# Patient Record
Sex: Male | Born: 1995 | Race: White | Hispanic: No | Marital: Single | Smoking: Never smoker
Health system: Southern US, Community
[De-identification: ages and names within clinical notes are randomized; demographics above are authoritative.]

## PROBLEM LIST (undated history)

## (undated) DIAGNOSIS — I10 Essential (primary) hypertension: Secondary | ICD-10-CM

## (undated) DIAGNOSIS — Z87442 Personal history of urinary calculi: Secondary | ICD-10-CM

## (undated) DIAGNOSIS — J45909 Unspecified asthma, uncomplicated: Secondary | ICD-10-CM

## (undated) HISTORY — PX: EUSTACHIAN TUBE DILATION: SHX6770

## (undated) HISTORY — DX: Unspecified asthma, uncomplicated: J45.909

## (undated) HISTORY — PX: WRIST FRACTURE SURGERY: SHX121

## (undated) HISTORY — PX: OTHER SURGICAL HISTORY: SHX169

## (undated) HISTORY — DX: Essential (primary) hypertension: I10

---
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
## 2015-05-16 ENCOUNTER — Other Ambulatory Visit: Payer: Self-pay | Admitting: Orthopedic Surgery

## 2015-05-16 DIAGNOSIS — M25511 Pain in right shoulder: Secondary | ICD-10-CM

## 2015-05-29 ENCOUNTER — Other Ambulatory Visit: Payer: Self-pay

## 2015-06-06 ENCOUNTER — Other Ambulatory Visit: Payer: Self-pay

## 2015-06-06 ENCOUNTER — Ambulatory Visit
Admission: RE | Admit: 2015-06-06 | Discharge: 2015-06-06 | Disposition: A | Discharge status: Home / Self Care | Payer: 59 | Source: Ambulatory Visit | Attending: Orthopedic Surgery | Admitting: Orthopedic Surgery

## 2015-06-06 DIAGNOSIS — M25511 Pain in right shoulder: Secondary | ICD-10-CM

## 2015-06-06 MED ORDER — IOHEXOL 180 MG/ML  SOLN
14.0000 mL | Freq: Once | INTRAMUSCULAR | Status: AC | PRN
  Administered 2015-06-06: 14 mL via INTRA_ARTICULAR

## 2015-06-06 MED ORDER — METHYLPREDNISOLONE ACETATE 40 MG/ML INJ SUSP (RADIOLOG
120.0000 mg | Freq: Once | INTRAMUSCULAR | Status: AC
  Administered 2015-06-06: 120 mg via INTRA_ARTICULAR

## 2016-02-26 ENCOUNTER — Emergency Department: Payer: 59

## 2016-02-26 ENCOUNTER — Encounter: Payer: Self-pay | Admitting: *Deleted

## 2016-02-26 ENCOUNTER — Emergency Department
Admission: EM | Admit: 2016-02-26 | Discharge: 2016-02-26 | Disposition: A | Discharge status: Home / Self Care | Payer: 59 | Attending: Emergency Medicine | Admitting: Emergency Medicine

## 2016-02-26 DIAGNOSIS — N23 Unspecified renal colic: Secondary | ICD-10-CM

## 2016-02-26 DIAGNOSIS — R1031 Right lower quadrant pain: Secondary | ICD-10-CM | POA: Diagnosis present

## 2016-02-26 LAB — CBC
HCT: 50 % (ref 40.0–52.0)
HEMOGLOBIN: 17.5 g/dL (ref 13.0–18.0)
MCH: 28.9 pg (ref 26.0–34.0)
MCHC: 34.9 g/dL (ref 32.0–36.0)
MCV: 82.7 fL (ref 80.0–100.0)
Platelets: 219 10*3/uL (ref 150–440)
RBC: 6.05 MIL/uL — ABNORMAL HIGH (ref 4.40–5.90)
RDW: 14.1 % (ref 11.5–14.5)
WBC: 9 10*3/uL (ref 3.8–10.6)

## 2016-02-26 LAB — URINALYSIS COMPLETE WITH MICROSCOPIC (ARMC ONLY)
BILIRUBIN URINE: NEGATIVE
Bacteria, UA: NONE SEEN
GLUCOSE, UA: NEGATIVE mg/dL
Ketones, ur: NEGATIVE mg/dL
Leukocytes, UA: NEGATIVE
NITRITE: NEGATIVE
Protein, ur: 100 mg/dL — AB
SPECIFIC GRAVITY, URINE: 1.023 (ref 1.005–1.030)
Squamous Epithelial / LPF: NONE SEEN
pH: 7 (ref 5.0–8.0)

## 2016-02-26 LAB — COMPREHENSIVE METABOLIC PANEL
ALBUMIN: 5.1 g/dL — AB (ref 3.5–5.0)
ALK PHOS: 85 U/L (ref 38–126)
ALT: 39 U/L (ref 17–63)
AST: 35 U/L (ref 15–41)
Anion gap: 10 (ref 5–15)
BILIRUBIN TOTAL: 1.6 mg/dL — AB (ref 0.3–1.2)
BUN: 14 mg/dL (ref 6–20)
CALCIUM: 9.5 mg/dL (ref 8.9–10.3)
CO2: 21 mmol/L — AB (ref 22–32)
CREATININE: 0.92 mg/dL (ref 0.61–1.24)
Chloride: 108 mmol/L (ref 101–111)
GFR calc Af Amer: >60 mL/min (ref >60)
GFR calc non Af Amer: >60 mL/min (ref >60)
GLUCOSE: 114 mg/dL — AB (ref 65–99)
Potassium: 3.7 mmol/L (ref 3.5–5.1)
SODIUM: 139 mmol/L (ref 135–145)
TOTAL PROTEIN: 7.5 g/dL (ref 6.5–8.1)

## 2016-02-26 LAB — LIPASE, BLOOD: Lipase: 18 U/L (ref 11–51)

## 2016-02-26 MED ORDER — HYDROMORPHONE HCL 1 MG/ML IJ SOLN
1.0000 mg | Freq: Once | INTRAMUSCULAR | Status: AC
  Administered 2016-02-26: 1 mg via INTRAVENOUS
  Filled 2016-02-26: qty 1

## 2016-02-26 MED ORDER — SODIUM CHLORIDE 0.9 % IV BOLUS (SEPSIS)
1000.0000 mL | Freq: Once | INTRAVENOUS | Status: AC
  Administered 2016-02-26: 1000 mL via INTRAVENOUS

## 2016-02-26 MED ORDER — OXYCODONE-ACETAMINOPHEN 5-325 MG PO TABS: 1.0000 | ORAL_TABLET | Freq: Four times a day (QID) | ORAL | 0 refills | Status: DC | PRN

## 2016-02-26 MED ORDER — ONDANSETRON 4 MG PO TBDP
4.0000 mg | ORAL_TABLET | Freq: Once | ORAL | Status: AC | PRN
  Administered 2016-02-26: 4 mg via ORAL
  Filled 2016-02-26: qty 1

## 2016-02-26 MED ORDER — IBUPROFEN 800 MG PO TABS: 800.0000 mg | ORAL_TABLET | Freq: Three times a day (TID) | ORAL | 0 refills | Status: DC | PRN

## 2016-02-26 MED ORDER — KETOROLAC TROMETHAMINE 30 MG/ML IJ SOLN
30.0000 mg | Freq: Once | INTRAMUSCULAR | Status: AC
  Administered 2016-02-26: 30 mg via INTRAVENOUS
  Filled 2016-02-26: qty 1

## 2016-02-26 MED ORDER — ONDANSETRON HCL 4 MG/2ML IJ SOLN
4.0000 mg | Freq: Once | INTRAMUSCULAR | Status: AC
  Administered 2016-02-26: 4 mg via INTRAVENOUS
  Filled 2016-02-26: qty 2

## 2016-02-26 MED ORDER — TAMSULOSIN HCL 0.4 MG PO CAPS: 0.4000 mg | ORAL_CAPSULE | Freq: Every day | ORAL | 0 refills | Status: DC

## 2016-02-26 NOTE — ED Triage Notes (Signed)
Pt states RLQ abd pain since this AM with vomiting, pt awake and alert

## 2016-02-26 NOTE — ED Notes (Signed)
Pt in CT at this time.

## 2016-02-26 NOTE — ED Provider Notes (Signed)
[facility]-EMERGENCY DEPARTMENT Provider Note   CSN: [id number] Arrival date & time: 02/26/16  1133     History   Chief Complaint Chief Complaint  Patient presents with  . Abdominal Pain    HPI Gregory Fry is a 20 y.o. male of healthy here presenting with right lower quadrant pain, right flank pain. Patient woke around 8 AM and had acute onset of sharp right lower quadrant pain that radiated to the right flank. Denies any testicular pain. While in the ED, he started developing bright red hematuria. No previous hx of kidney stones. Denies fevers.   The history is provided by the patient.    History reviewed. No pertinent past medical history.  There are no active problems to display for this patient.   No past surgical history on file.     Home Medications    Prior to Admission medications   Not on File    Family History History reviewed. No pertinent family history.  Social History Social History  Substance Use Topics  . Smoking status: Not on file  . Smokeless tobacco: Not on file  . Alcohol use Not on file     Allergies   Review of patient's allergies indicates no known allergies.   Review of Systems Review of Systems  Gastrointestinal: Positive for abdominal pain.  All other systems reviewed and are negative.    Physical Exam Updated Vital Signs BP (!) 155/85 (BP Location: Left Arm)   Pulse 64   Temp 97.5 F (36.4 C) (Oral)   Resp 18   Ht 6\' 4"  (1.93 m)   Wt 290 lb (131.5 kg)   SpO2 99%   BMI 35.30 kg/m   Physical Exam  Constitutional: He is oriented to person, place, and time.  Uncomfortable, writhing around   HENT:  Head: Normocephalic.  Mouth/Throat: Oropharynx is clear and moist.  Eyes: EOM are normal. Pupils are equal, round, and reactive to light.  Neck: Normal range of motion. Neck supple.  Cardiovascular: Normal rate, regular rhythm and normal heart sounds.   Pulmonary/Chest: Effort normal and breath sounds normal. No  respiratory distress. He has no wheezes. He has no rales.  Abdominal: Soft. Bowel sounds are normal.  + RLQ tenderness, no rebound. Mild R CVAT   Genitourinary:  Genitourinary Comments: Testicles nontender, good lie   Musculoskeletal: Normal range of motion.  Neurological: He is alert and oriented to person, place, and time.  Skin: Skin is warm.  Psychiatric: He has a normal mood and affect.  Nursing note and vitals reviewed.    ED Treatments / Results  Labs (all labs ordered are listed, but only abnormal results are displayed) Labs Reviewed  COMPREHENSIVE METABOLIC PANEL - Abnormal; Notable for the following:       Result Value   CO2 21 (*)    Glucose, Bld 114 (*)    Albumin 5.1 (*)    Total Bilirubin 1.6 (*)    All other components within normal limits  CBC - Abnormal; Notable for the following:    RBC 6.05 (*)    All other components within normal limits  URINALYSIS COMPLETEWITH MICROSCOPIC ([facility] ONLY) - Abnormal; Notable for the following:    Color, Urine YELLOW (*)    APPearance CLOUDY (*)    Hgb urine dipstick 3+ (*)    Protein, ur 100 (*)    All other components within normal limits  LIPASE, BLOOD    EKG  EKG Interpretation None  Radiology Ct Renal Stone Study  Result Date: 02/26/2016 CLINICAL DATA:  Right-sided abdomen and right flank pain, some vomiting EXAM: CT ABDOMEN AND PELVIS WITHOUT CONTRAST TECHNIQUE: Multidetector CT imaging of the abdomen and pelvis was performed following the standard protocol without IV contrast. COMPARISON:  None. FINDINGS: Lower chest: The lung bases are clear. Hepatobiliary: The liver is unremarkable in the unenhanced state. No calcified gallstones are seen. Pancreas: The pancreas is normal in size and the pancreatic duct is not dilated. Spleen: The spleen is unremarkable. Adrenals/Urinary Tract: The adrenal glands are symmetrical and normal. A single 3 mm left upper pole renal calculus is present without obstruction.  However, there is evidence of mild to moderate right hydronephrosis and hydroureter. The right ureter remains slightly dilated to the urinary bladder where there is a 2 mm calculus at the right UV junction. The left ureter is unremarkable. The urinary bladder is decompressed and cannot be well evaluated. Stomach/Bowel: The stomach is minimally distended but no abnormality is evident. No small bowel distention is seen. The colon is largely decompressed. The terminal ileum and the appendix are unremarkable. Vascular/Lymphatic: The abdominal aorta is normal in caliber. No adenopathy is seen. Reproductive: The prostate gland is normal in size. Other: None Musculoskeletal: The lumbar vertebrae are in normal alignment. 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 IMPRESSION: 1. 2 mm right ureteral calculus at the right UV junction creating mild right hydronephrosis and hydroureter. 2. Single nonobstructing left upper pole renal calculus of 3 mm in diameter. 3. Bilateral pars defects at L5.  No malalignment Electronically Signed   By: Dwyane DeePaul  Barry M.D.   On: 02/26/2016 13:21    Procedures Procedures (including critical care time)  Medications Ordered in ED Medications  ondansetron (ZOFRAN-ODT) disintegrating tablet 4 mg (4 mg Oral Given 02/26/16 1151)  HYDROmorphone (DILAUDID) injection 1 mg (1 mg Intravenous Given 02/26/16 1232)  sodium chloride 0.9 % bolus 1,000 mL (1,000 mLs Intravenous New Bag/Given 02/26/16 1226)  ondansetron (ZOFRAN) injection 4 mg (4 mg Intravenous Given 02/26/16 1228)  ketorolac (TORADOL) 30 MG/ML injection 30 mg (30 mg Intravenous Given 02/26/16 1233)     Initial Impression / Assessment and Plan / ED Course  I have reviewed the triage vital signs and the nursing notes.  Pertinent labs & imaging results that were available during my care of the patient were reviewed by me and considered in my medical decision making (see chart for details).  Clinical Course      Gregory Fry is a 20 y.o. male here with RLQ pain, hematuria. Likely renal colic. Will get labs, UA. CT renal stone. Will give pain meds and IVF and reassess.   2:19 PM Pain controlled. Cr 0.9. UA + blood, and WBC but has no leuk or nitrate or bacteria. I think WBC count in the UA may be contamination from over hematuria. Urine culture sent and hold off on abx. Labs unremarkable. CT showed R 2 mm stone with hydro, some L intra renal stones. Will dc home with motrin, percocet, flomax, urology follow up    Final Clinical Impressions(s) / ED Diagnoses   Final diagnoses:  None    New Prescriptions New Prescriptions   No medications on file     Charlynne Panderavid Hsienta Cyler Kappes, MD 02/26/16 1421

## 2016-02-26 NOTE — ED Triage Notes (Signed)
Pt with RUQ pain started this am with vomiting.

## 2016-02-26 NOTE — Discharge Instructions (Signed)
Stay hydrated.   Avoid red meat.   See urology.   Take motrin for pain.   Take flomax.  Take percocet for severe pain. Do NOT drive with it.   Return to Er if you have severe abdominal pain, vomiting, fevers

## 2016-02-28 LAB — URINE CULTURE

## 2017-01-06 ENCOUNTER — Encounter: Payer: Self-pay | Admitting: Emergency Medicine

## 2017-01-06 ENCOUNTER — Emergency Department
Admission: EM | Admit: 2017-01-06 | Discharge: 2017-01-06 | Disposition: A | Discharge status: Home / Self Care | Payer: BLUE CROSS/BLUE SHIELD | Attending: Emergency Medicine | Admitting: Emergency Medicine

## 2017-01-06 ENCOUNTER — Emergency Department: Payer: BLUE CROSS/BLUE SHIELD

## 2017-01-06 DIAGNOSIS — Z79899 Other long term (current) drug therapy: Secondary | ICD-10-CM | POA: Insufficient documentation

## 2017-01-06 DIAGNOSIS — N200 Calculus of kidney: Secondary | ICD-10-CM | POA: Diagnosis not present

## 2017-01-06 DIAGNOSIS — R1030 Lower abdominal pain, unspecified: Secondary | ICD-10-CM | POA: Diagnosis present

## 2017-01-06 LAB — URINALYSIS, COMPLETE (UACMP) WITH MICROSCOPIC
BACTERIA UA: NONE SEEN
Bilirubin Urine: NEGATIVE
Glucose, UA: NEGATIVE mg/dL
KETONES UR: NEGATIVE mg/dL
Leukocytes, UA: NEGATIVE
Nitrite: NEGATIVE
Protein, ur: NEGATIVE mg/dL
SQUAMOUS EPITHELIAL / LPF: NONE SEEN
Specific Gravity, Urine: 1.026 (ref 1.005–1.030)
pH: 6 (ref 5.0–8.0)

## 2017-01-06 LAB — CBC WITH DIFFERENTIAL/PLATELET
BASOS ABS: 0.1 10*3/uL (ref 0–0.1)
Basophils Relative: 1 %
EOS PCT: 2 %
Eosinophils Absolute: 0.2 10*3/uL (ref 0–0.7)
HCT: 45.9 % (ref 40.0–52.0)
Hemoglobin: 16.2 g/dL (ref 13.0–18.0)
LYMPHS PCT: 32 %
Lymphs Abs: 3 10*3/uL (ref 1.0–3.6)
MCH: 28.9 pg (ref 26.0–34.0)
MCHC: 35.4 g/dL (ref 32.0–36.0)
MCV: 81.7 fL (ref 80.0–100.0)
Monocytes Absolute: 1.3 10*3/uL — ABNORMAL HIGH (ref 0.2–1.0)
Monocytes Relative: 14 %
NEUTROS PCT: 51 %
Neutro Abs: 4.8 10*3/uL (ref 1.4–6.5)
PLATELETS: 240 10*3/uL (ref 150–440)
RBC: 5.61 MIL/uL (ref 4.40–5.90)
RDW: 12.8 % (ref 11.5–14.5)
WBC: 9.4 10*3/uL (ref 3.8–10.6)

## 2017-01-06 LAB — COMPREHENSIVE METABOLIC PANEL
ALT: 38 U/L (ref 17–63)
AST: 38 U/L (ref 15–41)
Albumin: 4.6 g/dL (ref 3.5–5.0)
Alkaline Phosphatase: 61 U/L (ref 38–126)
Anion gap: 6 (ref 5–15)
BUN: 20 mg/dL (ref 6–20)
CHLORIDE: 109 mmol/L (ref 101–111)
CO2: 26 mmol/L (ref 22–32)
CREATININE: 0.99 mg/dL (ref 0.61–1.24)
Calcium: 9.2 mg/dL (ref 8.9–10.3)
GFR calc Af Amer: >60 mL/min (ref >60)
Glucose, Bld: 134 mg/dL — ABNORMAL HIGH (ref 65–99)
Potassium: 3.6 mmol/L (ref 3.5–5.1)
SODIUM: 141 mmol/L (ref 135–145)
Total Bilirubin: 0.9 mg/dL (ref 0.3–1.2)
Total Protein: 7.2 g/dL (ref 6.5–8.1)

## 2017-01-06 MED ORDER — OXYCODONE-ACETAMINOPHEN 5-325 MG PO TABS
2.0000 | ORAL_TABLET | Freq: Once | ORAL | Status: AC
  Administered 2017-01-06: 2 via ORAL
  Filled 2017-01-06: qty 2

## 2017-01-06 MED ORDER — ONDANSETRON HCL 4 MG/2ML IJ SOLN
4.0000 mg | Freq: Once | INTRAMUSCULAR | Status: AC
  Administered 2017-01-06: 4 mg via INTRAVENOUS

## 2017-01-06 MED ORDER — TAMSULOSIN HCL 0.4 MG PO CAPS
0.4000 mg | ORAL_CAPSULE | Freq: Once | ORAL | Status: AC
  Administered 2017-01-06: 0.4 mg via ORAL
  Filled 2017-01-06: qty 1

## 2017-01-06 MED ORDER — ONDANSETRON HCL 4 MG/2ML IJ SOLN
INTRAMUSCULAR | Status: AC
  Filled 2017-01-06: qty 2

## 2017-01-06 MED ORDER — MORPHINE SULFATE (PF) 4 MG/ML IV SOLN
4.0000 mg | Freq: Once | INTRAVENOUS | Status: AC
  Administered 2017-01-06: 4 mg via INTRAVENOUS

## 2017-01-06 MED ORDER — OXYCODONE-ACETAMINOPHEN 5-325 MG PO TABS: 1.0000 | ORAL_TABLET | Freq: Four times a day (QID) | ORAL | 0 refills | Status: DC | PRN

## 2017-01-06 MED ORDER — ONDANSETRON 4 MG PO TBDP: 4.0000 mg | ORAL_TABLET | Freq: Three times a day (TID) | ORAL | 0 refills | Status: DC | PRN

## 2017-01-06 MED ORDER — KETOROLAC TROMETHAMINE 30 MG/ML IJ SOLN
INTRAMUSCULAR | Status: AC
  Administered 2017-01-06: 30 mg via INTRAVENOUS
  Filled 2017-01-06: qty 1

## 2017-01-06 MED ORDER — TAMSULOSIN HCL 0.4 MG PO CAPS: 0.4000 mg | ORAL_CAPSULE | Freq: Every day | ORAL | 0 refills | Status: DC

## 2017-01-06 MED ORDER — MORPHINE SULFATE (PF) 4 MG/ML IV SOLN
INTRAVENOUS | Status: AC
  Filled 2017-01-06: qty 1

## 2017-01-06 MED ORDER — MORPHINE SULFATE (PF) 4 MG/ML IV SOLN
4.0000 mg | Freq: Once | INTRAVENOUS | Status: AC
  Administered 2017-01-06: 4 mg via INTRAVENOUS
  Filled 2017-01-06: qty 1

## 2017-01-06 NOTE — ED Triage Notes (Signed)
Patient ambulatory to triage with steady gait, without difficulty, appears uncomfortable, grimacing; st awoke with left lower abd pain accomp by N/V; st hx kidney stone

## 2017-01-06 NOTE — Discharge Instructions (Signed)
Please follow-up with urology if you have persistent pain.

## 2017-01-06 NOTE — ED Provider Notes (Signed)
[facility] Emergency Department Provider Note   ____________________________________________   First MD Initiated Contact with Patient 01/06/17 [phone]     (approximate)  I have reviewed the triage vital signs and the nursing notes.   HISTORY  Chief Complaint Abdominal Pain    HPI Gregory Fry is a 21 y.o. male who comes into the hospital today with some left flank and lower abdomen pain. The patient has a history of kidney stones about a year ago. The patient woke up his mother and stated that he was having a lot of pain. It's on the left side and it feels similar as to when he had kidney stones previously. He states that he is urinating well but there is no blood in it. He has had some nausea and vomiting that he vomited more during the time when he had pain versus after. The patient states that his pain radiates down to his testicle on the left.   History reviewed. No pertinent past medical history.  There are no active problems to display for this patient.   History reviewed. No pertinent surgical history.  Prior to Admission medications   Medication Sig Start Date End Date Taking? Authorizing Provider  ibuprofen (ADVIL,MOTRIN) 800 MG tablet Take 1 tablet (800 mg total) by mouth every 8 (eight) hours as needed. 02/26/16   Charlynne Pander, MD  ondansetron (ZOFRAN ODT) 4 MG disintegrating tablet Take 1 tablet (4 mg total) by mouth every 8 (eight) hours as needed for nausea or vomiting. 01/06/17   Rebecka Apley, MD  oxyCODONE-acetaminophen (PERCOCET) 5-325 MG tablet Take 1 tablet by mouth every 6 (six) hours as needed. 02/26/16   Charlynne Pander, MD  oxyCODONE-acetaminophen (ROXICET) 5-325 MG tablet Take 1 tablet by mouth every 6 (six) hours as needed. 01/06/17   Rebecka Apley, MD  tamsulosin (FLOMAX) 0.4 MG CAPS capsule Take 1 capsule (0.4 mg total) by mouth daily. 02/26/16   Charlynne Pander, MD  tamsulosin (FLOMAX) 0.4 MG CAPS  capsule Take 1 capsule (0.4 mg total) by mouth daily. 01/06/17   Rebecka Apley, MD    Allergies Patient has no known allergies.  No family history on file.  Social History Social History  Substance Use Topics  . Smoking status: Never Smoker  . Smokeless tobacco: Never Used  . Alcohol use Not on file    Review of Systems  Constitutional: No fever/chills Eyes: No visual changes. ENT: No sore throat. Cardiovascular: Denies chest pain. Respiratory: Denies shortness of breath. Gastrointestinal: abdominal pain, nausea, vomiting.  No diarrhea.  No constipation. Genitourinary: Negative for dysuria. Musculoskeletal: left back pain. Skin: Negative for rash. Neurological: Negative for headaches, focal weakness or numbness.   ____________________________________________   PHYSICAL EXAM:  VITAL SIGNS: ED Triage Vitals  Enc Vitals Group     BP 01/06/17 0432 (!) 144/95     Pulse Rate 01/06/17 0432 75     Resp 01/06/17 0432 20     Temp 01/06/17 0432 98.1 F (36.7 C)     Temp src --      SpO2 01/06/17 0432 97 %     Weight 01/06/17 0431 300 lb (136.1 kg)     Height 01/06/17 0431 6\' 4"  (1.93 m)     Head Circumference --      Peak Flow --      Pain Score 01/06/17 0431 10     Pain Loc --      Pain Edu? --  Excl. in GC? --     Constitutional: Alert and oriented. Well appearing and in severe distress. Eyes: Conjunctivae are normal. PERRL. EOMI. Head: Atraumatic. Nose: No congestion/rhinnorhea. Mouth/Throat: Mucous membranes are moist.  Oropharynx non-erythematous. Cardiovascular: Normal rate, regular rhythm. Grossly normal heart sounds.  Good peripheral circulation. Respiratory: Normal respiratory effort.  No retractions. Lungs CTAB. Gastrointestinal: Soft with some left lower abd tenderness to palpation. No distention. Positive bowel sounds, left CVA tenderness to palpation Musculoskeletal: No lower extremity tenderness nor edema.   Neurologic:  Normal speech and  language.  Skin:  Skin is warm, dry and intact.  Psychiatric: Mood and affect are normal.   ____________________________________________   LABS (all labs ordered are listed, but only abnormal results are displayed)  Labs Reviewed  CBC WITH DIFFERENTIAL/PLATELET - Abnormal; Notable for the following:       Result Value   Monocytes Absolute 1.3 (*)    All other components within normal limits  COMPREHENSIVE METABOLIC PANEL - Abnormal; Notable for the following:    Glucose, Bld 134 (*)    All other components within normal limits  URINALYSIS, COMPLETE (UACMP) WITH MICROSCOPIC - Abnormal; Notable for the following:    Color, Urine YELLOW (*)    APPearance CLEAR (*)    Hgb urine dipstick LARGE (*)    All other components within normal limits   ____________________________________________  EKG  none ____________________________________________  RADIOLOGY  Ct Renal Stone Study  Result Date: 01/06/2017 CLINICAL DATA:  Left lower abdominal pain with nausea and vomiting EXAM: CT ABDOMEN AND PELVIS WITHOUT CONTRAST TECHNIQUE: Multidetector CT imaging of the abdomen and pelvis was performed following the standard protocol without IV contrast. COMPARISON:  None. FINDINGS: Lower chest: No acute abnormality. Hepatobiliary: No focal liver abnormality is seen. No gallstones, gallbladder wall thickening, or biliary dilatation. Pancreas: Unremarkable. No pancreatic ductal dilatation or surrounding inflammatory changes. Spleen: Normal in size without focal abnormality. Adrenals/Urinary Tract: Normal adrenals. There is left hydronephrosis and ureteral dilatation due to an obstructing 2 mm calculus at the ureterovesical junction. There is an upper pole 3 mm left collecting system calculus. There are 2 mm calculi of the right renal collecting system. Right ureter is normal. Urinary bladder is normal. Stomach/Bowel: Stomach is within normal limits. Appendix is normal. No evidence of bowel wall thickening,  distention, or inflammatory changes. Vascular/Lymphatic: No significant vascular findings are present. No enlarged abdominal or pelvic lymph nodes. Reproductive: Unremarkable Other: No ascites. 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 IMPRESSION: 1. Obstructing 2 mm left UVJ calculus with moderate left hydronephrosis. 2. Bilateral nephrolithiasis 3. L5 spondylolysis with grade 1 spondylolisthesis. Electronically Signed   By: Ellery Plunk M.D.   On: 01/06/2017 05:41    ____________________________________________   PROCEDURES  Procedure(s) performed: None  Procedures  Critical Care performed: No  ____________________________________________   INITIAL IMPRESSION / ASSESSMENT AND PLAN / ED COURSE  Pertinent labs & imaging results that were available during my care of the patient were reviewed by me and considered in my medical decision making (see chart for details).  This is a 21 year old male who comes into the hospital today with some left flank and left lower abdomen pain. I did give the patient 2 doses of morphine as well as some Toradol for his pain. The medication did help with the symptoms. After the Toradol the patient was pain-free. The patient has a 2 mm stone on the left. He reports that he was out all day sweating and drank sweet tea. Since  the patient's pain is improved she'll be discharged to follow-up with urology.      ____________________________________________   FINAL CLINICAL IMPRESSION(S) / ED DIAGNOSES  Final diagnoses:  Kidney stone      NEW MEDICATIONS STARTED DURING THIS VISIT:  New Prescriptions   ONDANSETRON (ZOFRAN ODT) 4 MG DISINTEGRATING TABLET    Take 1 tablet (4 mg total) by mouth every 8 (eight) hours as needed for nausea or vomiting.   OXYCODONE-ACETAMINOPHEN (ROXICET) 5-325 MG TABLET    Take 1 tablet by mouth every 6 (six) hours as needed.   TAMSULOSIN (FLOMAX) 0.4 MG CAPS CAPSULE    Take 1 capsule  (0.4 mg total) by mouth daily.     Note:  This document was prepared using Dragon voice recognition software and may include unintentional dictation errors.    Rebecka ApleyWebster, Colinda Barth P, MD 01/06/17 [phone]

## 2017-02-04 ENCOUNTER — Ambulatory Visit (INDEPENDENT_AMBULATORY_CARE_PROVIDER_SITE_OTHER): Payer: BLUE CROSS/BLUE SHIELD | Admitting: Urology

## 2017-02-04 ENCOUNTER — Encounter: Payer: Self-pay | Admitting: Urology

## 2017-02-04 VITALS — BP 129/78 | HR 106 | Ht 76.0 in | Wt 290.9 lb

## 2017-02-04 DIAGNOSIS — N200 Calculus of kidney: Secondary | ICD-10-CM | POA: Diagnosis not present

## 2017-02-04 LAB — URINALYSIS, COMPLETE
Bilirubin, UA: NEGATIVE
GLUCOSE, UA: NEGATIVE
LEUKOCYTES UA: NEGATIVE
Nitrite, UA: NEGATIVE
Specific Gravity, UA: >1.03 — ABNORMAL HIGH (ref 1.005–1.030)
Urobilinogen, Ur: 0.2 mg/dL (ref 0.2–1.0)
pH, UA: 5.5 (ref 5.0–7.5)

## 2017-02-04 NOTE — Progress Notes (Addendum)
02/04/2017 2:29 PM   Gregory Fry 08-24-1995 [id number]  Referring provider: [facility] [address] [place], [place] [address]  Chief Complaint  Patient presents with  . Nephrolithiasis    HPI: The patient is a 21 year old gentleman who presents for follow-up after being diagnosed with 2 mm left UVJ stone with moderate left hydronephrosis proximally 1 month ago in the emergency department.  I review the images, he also was found to have a 3 mm nonobstructing left upper pole calculus. He believes he has passed that stone because he is now asymptomatic. This is his second stone episode in his life. His last stone passed spontaneously last year. He had no previous history of surgery for stones. When he did present he was having left flank and groin pain. That has again resolved. He currently is asymptomatic with no complaints.   PMH: No past medical history on file.  Surgical History: No past surgical history on file.  Home Medications:  Allergies as of 02/04/2017   No Known Allergies     Medication List       Accurate as of 02/04/17  2:29 PM. Always use your most recent med list.          ibuprofen 800 MG tablet Commonly known as:  ADVIL,MOTRIN Take 1 tablet (800 mg total) by mouth every 8 (eight) hours as needed.   ondansetron 4 MG disintegrating tablet Commonly known as:  ZOFRAN ODT Take 1 tablet (4 mg total) by mouth every 8 (eight) hours as needed for nausea or vomiting.   oxyCODONE-acetaminophen 5-325 MG tablet Commonly known as:  ROXICET Take 1 tablet by mouth every 6 (six) hours as needed.   tamsulosin 0.4 MG Caps capsule Commonly known as:  FLOMAX Take 1 capsule (0.4 mg total) by mouth daily.            Discharge Care Instructions        Start     Ordered   02/04/17 0000  Urinalysis, Complete     02/04/17 1351   02/04/17 0000  US RENAL    Question Answer Comment  Reason for Exam (SYMPTOM  OR DIAGNOSIS REQUIRED)  kidney stone follow up   Preferred imaging location? [facility]      02/04/17 1429      Allergies: No Known Allergies  Family History: Family History  Problem Relation Age of Onset  . Bladder Cancer Neg Hx   . Kidney cancer Neg Hx   . Prostate cancer Neg Hx     Social History:  reports that he has never smoked. He has never used smokeless tobacco. He reports that he uses drugs, including Marijuana. He reports that he does not drink alcohol.  ROS: UROLOGY Frequent Urination?: No Hard to postpone urination?: No Burning/pain with urination?: No Get up at night to urinate?: No Leakage of urine?: No Urine stream starts and stops?: No Trouble starting stream?: No Do you have to strain to urinate?: No Blood in urine?: No Urinary tract infection?: No Sexually transmitted disease?: No Injury to kidneys or bladder?: No Painful intercourse?: No Weak stream?: No Erection problems?: No Penile pain?: No  Gastrointestinal Nausea?: No Vomiting?: No Indigestion/heartburn?: No Diarrhea?: No Constipation?: No  Constitutional Fever: No Night sweats?: No Weight loss?: No Fatigue?: No  Skin Skin rash/lesions?: No Itching?: No  Eyes Blurred vision?: No Double vision?: No  Ears/Nose/Throat Sore throat?: No Sinus problems?: No  Hematologic/Lymphatic Swollen glands?: No Easy bruising?: No  Cardiovascular  Leg swelling?: No Chest pain?: No  Respiratory Cough?: No Shortness of breath?: No  Endocrine Excessive thirst?: No  Musculoskeletal Back pain?: No Joint pain?: No  Neurological Headaches?: No Dizziness?: No  Psychologic Depression?: No Anxiety?: No  Physical Exam: BP 129/78 (BP Location: Right Arm, Patient Position: Sitting, Cuff Size: Large)   Pulse (!) 106   Ht  (1.93 m)   Wt 290 lb 14.4 oz (132 kg)   BMI 35.41 kg/m   Constitutional:  Alert and oriented, No acute distress. HEENT: [place] AT, moist mucus membranes.  Trachea midline, no  masses. Cardiovascular: No clubbing, cyanosis, or edema. Respiratory: Normal respiratory effort, no increased work of breathing. GI: Abdomen is soft, nontender, nondistended, no abdominal masses GU: No CVA tenderness.  Skin: No rashes, bruises or suspicious lesions. Lymph: No cervical or inguinal adenopathy. Neurologic: Grossly intact, no focal deficits, moving all 4 extremities. Psychiatric: Normal mood and affect.  Laboratory Data: Lab Results  Component Value Date   WBC 9.4 01/06/2017   HGB 16.2 01/06/2017   HCT 45.9 01/06/2017   MCV 81.7 01/06/2017   PLT 240 01/06/2017    Lab Results  Component Value Date   CREATININE 0.99 01/06/2017    No results found for: PSA  No results found for: TESTOSTERONE  No results found for: HGBA1C  Urinalysis    Component Value Date/Time   COLORURINE YELLOW (A) 01/06/2017 0550   APPEARANCEUR CLEAR (A) 01/06/2017 0550   LABSPEC 1.026 01/06/2017 0550   PHURINE 6.0 01/06/2017 0550   GLUCOSEU NEGATIVE 01/06/2017 0550   HGBUR LARGE (A) 01/06/2017 0550   BILIRUBINUR NEGATIVE 01/06/2017 0550   KETONESUR NEGATIVE 01/06/2017 0550   PROTEINUR NEGATIVE 01/06/2017 0550   NITRITE NEGATIVE 01/06/2017 0550   LEUKOCYTESUR NEGATIVE 01/06/2017 0550    Pertinent Imaging: CT stone protocol reviewed by myself as discussed above.  Assessment & Plan:    1. 2 mm left UVJ stone I discussed with the patient that his stone is likely passed based on his presentation and clinical findings. However, I would like to confirm that his hydronephrosis has resolved to ensure that his stone is gone. We'll obtain a renal ultrasound and call the patient with the results. I also gave the patient a stone basket since he still has a stone in the left kidney and we do not know stone composition at this time. We did go over ways to prevent stone formation and the ABC's of stone formation booklet. He would probably benefit fit for metabolic workup, however this would be more  beneficial if we knew his stone composition. He will bring [place] a stone if he passes one in the future for analysis. We will call with results of his renal ultrasound. Otherwise, he will follow-up as needed.  Hildred Laser, MD  [facility] [address] [place], [place] [address] [phone]

## 2019-03-11 ENCOUNTER — Emergency Department: Payer: No Typology Code available for payment source

## 2019-03-11 ENCOUNTER — Other Ambulatory Visit: Payer: Self-pay

## 2019-03-11 ENCOUNTER — Encounter: Payer: Self-pay | Admitting: Emergency Medicine

## 2019-03-11 DIAGNOSIS — Z79899 Other long term (current) drug therapy: Secondary | ICD-10-CM | POA: Insufficient documentation

## 2019-03-11 DIAGNOSIS — R1030 Lower abdominal pain, unspecified: Secondary | ICD-10-CM | POA: Diagnosis present

## 2019-03-11 DIAGNOSIS — N1330 Unspecified hydronephrosis: Secondary | ICD-10-CM | POA: Diagnosis not present

## 2019-03-11 DIAGNOSIS — N201 Calculus of ureter: Secondary | ICD-10-CM | POA: Diagnosis not present

## 2019-03-11 LAB — URINALYSIS, COMPLETE (UACMP) WITH MICROSCOPIC
Bacteria, UA: NONE SEEN
Bilirubin Urine: NEGATIVE
Glucose, UA: NEGATIVE mg/dL
Ketones, ur: NEGATIVE mg/dL
Leukocytes,Ua: NEGATIVE
Nitrite: NEGATIVE
Protein, ur: 30 mg/dL — AB
RBC / HPF: >50 RBC/hpf — ABNORMAL HIGH (ref 0–5)
Specific Gravity, Urine: 1.026 (ref 1.005–1.030)
pH: 5 (ref 5.0–8.0)

## 2019-03-11 LAB — CBC
HCT: 48.5 % (ref 39.0–52.0)
Hemoglobin: 16.6 g/dL (ref 13.0–17.0)
MCH: 28.2 pg (ref 26.0–34.0)
MCHC: 34.2 g/dL (ref 30.0–36.0)
MCV: 82.3 fL (ref 80.0–100.0)
Platelets: 300 10*3/uL (ref 150–400)
RBC: 5.89 MIL/uL — ABNORMAL HIGH (ref 4.22–5.81)
RDW: 12.3 % (ref 11.5–15.5)
WBC: 12.4 10*3/uL — ABNORMAL HIGH (ref 4.0–10.5)
nRBC: 0 % (ref 0.0–0.2)

## 2019-03-11 LAB — COMPREHENSIVE METABOLIC PANEL
ALT: 34 U/L (ref 0–44)
AST: 31 U/L (ref 15–41)
Albumin: 5.1 g/dL — ABNORMAL HIGH (ref 3.5–5.0)
Alkaline Phosphatase: 73 U/L (ref 38–126)
Anion gap: 13 (ref 5–15)
BUN: 12 mg/dL (ref 6–20)
CO2: 22 mmol/L (ref 22–32)
Calcium: 9.9 mg/dL (ref 8.9–10.3)
Chloride: 106 mmol/L (ref 98–111)
Creatinine, Ser: 0.95 mg/dL (ref 0.61–1.24)
GFR calc Af Amer: >60 mL/min (ref >60)
GFR calc non Af Amer: >60 mL/min (ref >60)
Glucose, Bld: 116 mg/dL — ABNORMAL HIGH (ref 70–99)
Potassium: 3.8 mmol/L (ref 3.5–5.1)
Sodium: 141 mmol/L (ref 135–145)
Total Bilirubin: 2.1 mg/dL — ABNORMAL HIGH (ref 0.3–1.2)
Total Protein: 7.9 g/dL (ref 6.5–8.1)

## 2019-03-11 LAB — LIPASE, BLOOD: Lipase: 16 U/L (ref 11–51)

## 2019-03-11 MED ORDER — OXYCODONE-ACETAMINOPHEN 5-325 MG PO TABS
1.0000 | ORAL_TABLET | ORAL | Status: DC | PRN
  Administered 2019-03-11: 1 via ORAL
  Filled 2019-03-11: qty 1

## 2019-03-11 MED ORDER — MORPHINE SULFATE (PF) 4 MG/ML IV SOLN
4.0000 mg | Freq: Once | INTRAVENOUS | Status: AC
  Administered 2019-03-11: 4 mg via INTRAVENOUS
  Filled 2019-03-11: qty 1

## 2019-03-11 MED ORDER — ONDANSETRON 4 MG PO TBDP
4.0000 mg | ORAL_TABLET | Freq: Once | ORAL | Status: AC | PRN
  Administered 2019-03-11: 4 mg via ORAL
  Filled 2019-03-11: qty 1

## 2019-03-11 MED ORDER — SODIUM CHLORIDE 0.9 % IV BOLUS
1000.0000 mL | Freq: Once | INTRAVENOUS | Status: AC
  Administered 2019-03-11: 1000 mL via INTRAVENOUS

## 2019-03-11 MED ORDER — FENTANYL CITRATE (PF) 100 MCG/2ML IJ SOLN
50.0000 ug | INTRAMUSCULAR | Status: DC | PRN
  Administered 2019-03-11: 50 ug via INTRAVENOUS
  Filled 2019-03-11: qty 2

## 2019-03-11 NOTE — ED Triage Notes (Signed)
Patient with complaint of left lower abdominal pain and vomiting that started about 3 hours ago. Patient states that he has a history of kidney stones and that this feels the same.

## 2019-03-12 ENCOUNTER — Encounter: Payer: Self-pay | Admitting: Emergency Medicine

## 2019-03-12 ENCOUNTER — Emergency Department
Admission: EM | Admit: 2019-03-12 | Discharge: 2019-03-12 | Disposition: A | Discharge status: Home / Self Care | Payer: No Typology Code available for payment source | Attending: Emergency Medicine | Admitting: Emergency Medicine

## 2019-03-12 DIAGNOSIS — N201 Calculus of ureter: Secondary | ICD-10-CM

## 2019-03-12 HISTORY — DX: Personal history of urinary calculi: Z87.442

## 2019-03-12 MED ORDER — TAMSULOSIN HCL 0.4 MG PO CAPS: ORAL_CAPSULE | ORAL | 0 refills | Status: DC

## 2019-03-12 MED ORDER — OXYCODONE-ACETAMINOPHEN 5-325 MG PO TABS
2.0000 | ORAL_TABLET | Freq: Once | ORAL | Status: AC
  Administered 2019-03-12: 2 via ORAL
  Filled 2019-03-12: qty 2

## 2019-03-12 MED ORDER — KETOROLAC TROMETHAMINE 30 MG/ML IJ SOLN
15.0000 mg | Freq: Once | INTRAMUSCULAR | Status: AC
  Administered 2019-03-12: 15 mg via INTRAVENOUS
  Filled 2019-03-12: qty 1

## 2019-03-12 MED ORDER — OXYCODONE-ACETAMINOPHEN 5-325 MG PO TABS: 2.0000 | ORAL_TABLET | Freq: Four times a day (QID) | ORAL | 0 refills | Status: DC | PRN

## 2019-03-12 MED ORDER — ONDANSETRON 4 MG PO TBDP: ORAL_TABLET | ORAL | 0 refills | Status: DC

## 2019-03-12 MED ORDER — DOCUSATE SODIUM 100 MG PO CAPS: ORAL_CAPSULE | ORAL | 0 refills | Status: DC

## 2019-03-12 NOTE — Discharge Instructions (Addendum)
You have been seen in the Emergency Department (ED) today for pain caused by kidney stones.  As we have discussed, please drink plenty of fluids.  Please make a follow up appointment with the physician(s) listed elsewhere in this documentation.  You may take pain medication as needed but ONLY as prescribed.  Please also take your prescribed Flomax daily.  We also recommend that you take over-the-counter ibuprofen regularly according to label instructions over the next 5 days.  Take it with meals to minimize stomach discomfort.  Please see your doctor as soon as possible as stones may take 1-3 weeks to pass and you may require additional care or medications.  Do not drink alcohol, drive or participate in any other potentially dangerous activities while taking opiate pain medication as it may make you sleepy. Do not take this medication with any other sedating medications, either prescription or over-the-counter. If you were prescribed Percocet or Vicodin, do not take these with acetaminophen (Tylenol) as it is already contained within these medications.   Take Percocet as needed for severe pain.  This medication is an opiate (or narcotic) pain medication and can be habit forming.  Use it as little as possible to achieve adequate pain control.  Do not use or use it with extreme caution if you have a history of opiate abuse or dependence.  If you are on a pain contract with your primary care doctor or a pain specialist, be sure to let them know you were prescribed this medication today from the [facility].  This medication is intended for your use only - do not give any to anyone else and keep it in a secure place where nobody else, especially children, have access to it.  It will also cause or worsen constipation, so you may want to consider taking an over-the-counter stool softener while you are taking this medication.  Return to the Emergency Department (ED) or call your doctor  if you have any worsening pain, fever, painful urination, are unable to urinate, or develop other symptoms that concern you. 

## 2019-03-12 NOTE — ED Notes (Signed)
Patient reports having left lower abdominal pain that started prior to arrival to ED.  Reports history of previous kidney stones and reports this feels similar.

## 2019-03-12 NOTE — ED Provider Notes (Signed)
[facility] Emergency Department Provider Note  ____________________________________________   First MD Initiated Contact with Patient 03/12/19 [phone]     (approximate)  I have reviewed the triage vital signs and the nursing notes.   HISTORY  Chief Complaint Abdominal Pain    HPI Gregory Fry is a 23 y.o. male with no contributory medical history other than a prior history of kidney stones who presents for evaluation of acute onset and severe pain in the left side of his abdomen that radiates to the left side of his back.  Feels similar to prior kidney stones.  The pain is sharp and stabbing as well as a dull aching.  It was accompanied with nausea and vomiting.  He feels much better after getting some medication in triage.  He has not seen any blood in his urine or had any burning when he pees.   He denies fever/chills, sore throat, chest pain, shortness of breath.  No contact with COVID-19 patients.  No recent trauma.        Past Medical History:  Diagnosis Date  . History of kidney stones     There are no active problems to display for this patient.   History reviewed. No pertinent surgical history.  Prior to Admission medications   Medication Sig Start Date End Date Taking? Authorizing Provider  docusate sodium (COLACE) 100 MG capsule Take 1 tablet once or twice daily as needed for constipation while taking narcotic pain medicine 03/12/19   Hinda Kehr, MD  ibuprofen (ADVIL,MOTRIN) 800 MG tablet Take 1 tablet (800 mg total) by mouth every 8 (eight) hours as needed. 02/26/16   Drenda Freeze, MD  ondansetron (ZOFRAN ODT) 4 MG disintegrating tablet Allow 1-2 tablets to dissolve in your mouth every 8 hours as needed for nausea/vomiting 03/12/19   Hinda Kehr, MD  oxyCODONE-acetaminophen (PERCOCET) 5-325 MG tablet Take 2 tablets by mouth every 6 (six) hours as needed for severe pain. 03/12/19   Hinda Kehr, MD  tamsulosin (FLOMAX) 0.4 MG CAPS  capsule Take 1 tablet by mouth daily until you pass the kidney stone or no longer have symptoms 03/12/19   Hinda Kehr, MD    Allergies Patient has no known allergies.  Family History  Problem Relation Age of Onset  . Bladder Cancer Neg Hx   . Kidney cancer Neg Hx   . Prostate cancer Neg Hx     Social History Social History   Tobacco Use  . Smoking status: Never Smoker  . Smokeless tobacco: Never Used  Substance Use Topics  . Alcohol use: No  . Drug use: Not Currently    Types: Marijuana    Review of Systems Constitutional: No fever/chills Eyes: No visual changes. ENT: No sore throat. Cardiovascular: Denies chest pain. Respiratory: Denies shortness of breath. Gastrointestinal: Left-sided abdominal pain radiating to the back with nausea and vomiting. Genitourinary: Negative for dysuria. Musculoskeletal: Negative for neck pain.  Negative for back pain. Integumentary: Negative for rash. Neurological: Negative for headaches, focal weakness or numbness.   ____________________________________________   PHYSICAL EXAM:  VITAL SIGNS: ED Triage Vitals  Enc Vitals Group     BP 03/11/19 2111 (!) 152/98     Pulse Rate 03/11/19 2111 95     Resp 03/11/19 2111 18     Temp 03/11/19 2111 98.8 F (37.1 C)     Temp src --      SpO2 03/11/19 2111 97 %     Weight 03/11/19 2112 136.1 kg (  300 lb)     Height 03/11/19 2112 1.905 m (6\' 3" )     Head Circumference --      Peak Flow --      Pain Score 03/11/19 2112 10     Pain Loc --      Pain Edu? --      Excl. in GC? --     Constitutional: Alert and oriented.  Appears uncomfortable but not in acute distress. Eyes: Conjunctivae are normal.  Head: Atraumatic. Nose: No congestion/rhinnorhea. Mouth/Throat: Patient is wearing a mask. Neck: No stridor.  No meningeal signs.   Cardiovascular: Normal rate, regular rhythm. Good peripheral circulation. Grossly normal heart sounds. Respiratory: Normal respiratory effort.  No  retractions. Gastrointestinal: Soft and nontender. No distention.  Musculoskeletal: Left CVA tenderness to percussion, mild.  No lower extremity tenderness nor edema. No gross deformities of extremities. Neurologic:  Normal speech and language. No gross focal neurologic deficits are appreciated.  Skin:  Skin is warm, dry and intact. Psychiatric: Mood and affect are normal. Speech and behavior are normal.  ____________________________________________   LABS (all labs ordered are listed, but only abnormal results are displayed)  Labs Reviewed  COMPREHENSIVE METABOLIC PANEL - Abnormal; Notable for the following components:      Result Value   Glucose, Bld 116 (*)    Albumin 5.1 (*)    Total Bilirubin 2.1 (*)    All other components within normal limits  CBC - Abnormal; Notable for the following components:   WBC 12.4 (*)    RBC 5.89 (*)    All other components within normal limits  URINALYSIS, COMPLETE (UACMP) WITH MICROSCOPIC - Abnormal; Notable for the following components:   Color, Urine YELLOW (*)    APPearance HAZY (*)    Hgb urine dipstick LARGE (*)    Protein, ur 30 (*)    RBC / HPF >50 (*)    All other components within normal limits  LIPASE, BLOOD   ____________________________________________  EKG  None - EKG not ordered by ED physician ____________________________________________  RADIOLOGY Marylou MccoyI, Annalynne Ibanez, personally viewed and evaluated these images (plain radiographs) as part of my medical decision making, as well as reviewing the written report by the radiologist.  ED MD interpretation: 3 to 4 mm proximal left ureteral stone with some mild hydronephrosis.  Official radiology report(s): Ct Renal Stone Study  Result Date: 03/11/2019 CLINICAL DATA:  Left lower abdominal pain, vomiting EXAM: CT ABDOMEN AND PELVIS WITHOUT CONTRAST TECHNIQUE: Multidetector CT imaging of the abdomen and pelvis was performed following the standard protocol without IV contrast.  COMPARISON:  01/06/2017 FINDINGS: Lower chest: Lung bases are clear. No effusions. Heart is normal size. Hepatobiliary: Diffuse low-density throughout the liver compatible with fatty infiltration. No focal abnormality. Gallbladder unremarkable. Pancreas: No focal abnormality or ductal dilatation. Spleen: No focal abnormality.  Normal size. Adrenals/Urinary Tract: Punctate nonobstructing stones in the mid and upper pole of the right kidney. Mild left hydronephrosis due to 3-4 mm proximal left ureteral stone. Urinary bladder and adrenal glands unremarkable. Stomach/Bowel: Normal appendix. Stomach, large and small bowel grossly unremarkable. Vascular/Lymphatic: No evidence of aneurysm or adenopathy. Reproductive: No visible focal abnormality. Other: No free fluid or free air. Musculoskeletal: No acute bony abnormality. IMPRESSION: 3-4 mm proximal left ureteral stone with mild left hydronephrosis. Punctate right nephrolithiasis. Fatty infiltration of the liver. Electronically Signed   By: Charlett NoseKevin  Dover M.D.   On: 03/11/2019 23:47    ____________________________________________   PROCEDURES   Procedure(s) performed (including Critical  Care):  Procedures   ____________________________________________   INITIAL IMPRESSION / MDM / ASSESSMENT AND PLAN / ED COURSE  As part of my medical decision making, I reviewed the following data within the electronic MEDICAL RECORD NUMBER Nursing notes reviewed and incorporated, Labs reviewed , Old chart reviewed, Notes from prior ED visits and [place] Controlled Substance Database   Differential diagnosis includes, but is not limited to, renal/ureteral colic, UTI/pyelonephritis, infected stone, renal infarction, diverticulitis, SBO/ileus.  The patient is well-appearing in no distress, ambulatory without difficulty, stable vital signs.  Lab work is notable for hematuria without any evidence of infection and what is essentially a normal comprehensive metabolic panel except for a  slightly elevated total bilirubin which is likely not contributory.  He has a mild leukocytosis of 12.14 which I think is likely reactive and not indicative of acute infection.  He says he feels much better than before but he can still feel the pain.  I will give him Toradol 15 mg IV, which he says was the only thing that helped and the last time this happened, and 2 Percocet by mouth.  He received a Zofran 4 mg ODT and morphine 4 mg IV while waiting for a bed.  Standard prescriptions as listed below.  Urinary strainer and outpatient follow-up with urology.  I gave my usual customary return precautions.          ____________________________________________  FINAL CLINICAL IMPRESSION(S) / ED DIAGNOSES  Final diagnoses:  Left ureteral stone     MEDICATIONS GIVEN DURING THIS VISIT:  Medications  ondansetron (ZOFRAN-ODT) disintegrating tablet 4 mg (4 mg Oral Given 03/11/19 2120)  sodium chloride 0.9 % bolus 1,000 mL (0 mLs Intravenous Stopped 03/12/19 0005)  morphine 4 MG/ML injection 4 mg (4 mg Intravenous Given 03/11/19 2338)  ketorolac (TORADOL) 30 MG/ML injection 15 mg (15 mg Intravenous Given 03/12/19 0224)  oxyCODONE-acetaminophen (PERCOCET/ROXICET) 5-325 MG per tablet 2 tablet (2 tablets Oral Given 03/12/19 0224)     ED Discharge Orders         Ordered    oxyCODONE-acetaminophen (PERCOCET) 5-325 MG tablet  Every 6 hours PRN     03/12/19 0226    ondansetron (ZOFRAN ODT) 4 MG disintegrating tablet     03/12/19 0226    tamsulosin (FLOMAX) 0.4 MG CAPS capsule     03/12/19 0226    docusate sodium (COLACE) 100 MG capsule     03/12/19 0226          *Please note:  Gregory Fry was evaluated in Emergency Department on 03/12/2019 for the symptoms described in the history of present illness. He was evaluated in the context of the global COVID-19 pandemic, which necessitated consideration that the patient might be at risk for infection with the SARS-CoV-2 virus that causes COVID-19.  Institutional protocols and algorithms that pertain to the evaluation of patients at risk for COVID-19 are in a state of rapid change based on information released by regulatory bodies including the CDC and federal and state organizations. These policies and algorithms were followed during the patient's care in the ED.  Some ED evaluations and interventions may be delayed as a result of limited staffing during the pandemic.*  Note:  This document was prepared using Dragon voice recognition software and may include unintentional dictation errors.   [name], MD 03/12/19 [phone]

## 2020-02-14 ENCOUNTER — Other Ambulatory Visit: Payer: Self-pay | Admitting: Orthopedic Surgery

## 2020-02-14 DIAGNOSIS — M25561 Pain in right knee: Secondary | ICD-10-CM

## 2020-03-07 ENCOUNTER — Ambulatory Visit
Admission: RE | Admit: 2020-03-07 | Discharge: 2020-03-07 | Disposition: A | Discharge status: Home / Self Care | Payer: BC Managed Care – PPO | Source: Ambulatory Visit | Attending: Orthopedic Surgery | Admitting: Orthopedic Surgery

## 2020-03-07 DIAGNOSIS — M25561 Pain in right knee: Secondary | ICD-10-CM

## 2020-03-08 ENCOUNTER — Other Ambulatory Visit: Payer: Self-pay | Admitting: Orthopedic Surgery

## 2020-03-08 ENCOUNTER — Other Ambulatory Visit: Payer: Self-pay

## 2020-03-08 ENCOUNTER — Ambulatory Visit
Admission: RE | Admit: 2020-03-08 | Discharge: 2020-03-08 | Disposition: A | Discharge status: Home / Self Care | Payer: BC Managed Care – PPO | Source: Ambulatory Visit | Attending: Orthopedic Surgery | Admitting: Orthopedic Surgery

## 2020-03-08 ENCOUNTER — Ambulatory Visit
Admission: RE | Admit: 2020-03-08 | Discharge: 2020-03-08 | Disposition: A | Discharge status: Home / Self Care | Payer: BC Managed Care – PPO | Source: Ambulatory Visit

## 2020-03-08 DIAGNOSIS — Z77018 Contact with and (suspected) exposure to other hazardous metals: Secondary | ICD-10-CM

## 2020-10-08 ENCOUNTER — Other Ambulatory Visit: Payer: Self-pay | Admitting: Nurse Practitioner

## 2020-10-08 ENCOUNTER — Ambulatory Visit
Admission: RE | Admit: 2020-10-08 | Discharge: 2020-10-08 | Disposition: A | Discharge status: Home / Self Care | Payer: BC Managed Care – PPO | Source: Ambulatory Visit | Attending: Nurse Practitioner | Admitting: Nurse Practitioner

## 2020-10-08 DIAGNOSIS — R059 Cough, unspecified: Secondary | ICD-10-CM

## 2021-05-03 ENCOUNTER — Emergency Department (HOSPITAL_COMMUNITY): Payer: BC Managed Care – PPO

## 2021-05-03 ENCOUNTER — Inpatient Hospital Stay (HOSPITAL_BASED_OUTPATIENT_CLINIC_OR_DEPARTMENT_OTHER)
Admission: EM | Admit: 2021-05-03 | Discharge: 2021-05-05 | DRG: 552 | Disposition: A | Discharge status: Home / Self Care | Payer: BC Managed Care – PPO | Attending: Internal Medicine | Admitting: Internal Medicine

## 2021-05-03 ENCOUNTER — Other Ambulatory Visit: Payer: Self-pay

## 2021-05-03 ENCOUNTER — Encounter (HOSPITAL_BASED_OUTPATIENT_CLINIC_OR_DEPARTMENT_OTHER): Payer: Self-pay

## 2021-05-03 DIAGNOSIS — Z6839 Body mass index (BMI) 39.0-39.9, adult: Secondary | ICD-10-CM | POA: Diagnosis not present

## 2021-05-03 DIAGNOSIS — M48061 Spinal stenosis, lumbar region without neurogenic claudication: Secondary | ICD-10-CM | POA: Diagnosis not present

## 2021-05-03 DIAGNOSIS — T402X5A Adverse effect of other opioids, initial encounter: Secondary | ICD-10-CM | POA: Diagnosis not present

## 2021-05-03 DIAGNOSIS — R29898 Other symptoms and signs involving the musculoskeletal system: Secondary | ICD-10-CM | POA: Diagnosis present

## 2021-05-03 DIAGNOSIS — D72829 Elevated white blood cell count, unspecified: Secondary | ICD-10-CM | POA: Diagnosis not present

## 2021-05-03 DIAGNOSIS — M545 Low back pain, unspecified: Secondary | ICD-10-CM | POA: Diagnosis present

## 2021-05-03 DIAGNOSIS — R030 Elevated blood-pressure reading, without diagnosis of hypertension: Secondary | ICD-10-CM | POA: Diagnosis present

## 2021-05-03 DIAGNOSIS — Z20822 Contact with and (suspected) exposure to covid-19: Secondary | ICD-10-CM | POA: Diagnosis present

## 2021-05-03 DIAGNOSIS — K5903 Drug induced constipation: Secondary | ICD-10-CM | POA: Diagnosis not present

## 2021-05-03 DIAGNOSIS — Z79899 Other long term (current) drug therapy: Secondary | ICD-10-CM | POA: Diagnosis not present

## 2021-05-03 DIAGNOSIS — R32 Unspecified urinary incontinence: Secondary | ICD-10-CM | POA: Diagnosis present

## 2021-05-03 DIAGNOSIS — R200 Anesthesia of skin: Secondary | ICD-10-CM

## 2021-05-03 DIAGNOSIS — M5127 Other intervertebral disc displacement, lumbosacral region: Secondary | ICD-10-CM | POA: Diagnosis present

## 2021-05-03 DIAGNOSIS — E669 Obesity, unspecified: Secondary | ICD-10-CM | POA: Diagnosis present

## 2021-05-03 DIAGNOSIS — F909 Attention-deficit hyperactivity disorder, unspecified type: Secondary | ICD-10-CM | POA: Diagnosis present

## 2021-05-03 LAB — URINALYSIS, ROUTINE W REFLEX MICROSCOPIC
Bilirubin Urine: NEGATIVE
Glucose, UA: NEGATIVE mg/dL
Ketones, ur: NEGATIVE mg/dL
Leukocytes,Ua: NEGATIVE
Nitrite: NEGATIVE
Protein, ur: NEGATIVE mg/dL
Specific Gravity, Urine: 1.023 (ref 1.005–1.030)
pH: 7 (ref 5.0–8.0)

## 2021-05-03 LAB — CBC WITH DIFFERENTIAL/PLATELET
Abs Immature Granulocytes: 0.11 10*3/uL — ABNORMAL HIGH (ref 0.00–0.07)
Basophils Absolute: 0.1 10*3/uL (ref 0.0–0.1)
Basophils Relative: 0 %
Eosinophils Absolute: 0 10*3/uL (ref 0.0–0.5)
Eosinophils Relative: 0 %
HCT: 46.5 % (ref 39.0–52.0)
Hemoglobin: 15.9 g/dL (ref 13.0–17.0)
Immature Granulocytes: 1 %
Lymphocytes Relative: 7 %
Lymphs Abs: 1.1 10*3/uL (ref 0.7–4.0)
MCH: 28.7 pg (ref 26.0–34.0)
MCHC: 34.2 g/dL (ref 30.0–36.0)
MCV: 83.9 fL (ref 80.0–100.0)
Monocytes Absolute: 0.9 10*3/uL (ref 0.1–1.0)
Monocytes Relative: 6 %
Neutro Abs: 13.3 10*3/uL — ABNORMAL HIGH (ref 1.7–7.7)
Neutrophils Relative %: 86 %
Platelets: 292 10*3/uL (ref 150–400)
RBC: 5.54 MIL/uL (ref 4.22–5.81)
RDW: 13.1 % (ref 11.5–15.5)
WBC: 15.4 10*3/uL — ABNORMAL HIGH (ref 4.0–10.5)
nRBC: 0 % (ref 0.0–0.2)

## 2021-05-03 LAB — COMPREHENSIVE METABOLIC PANEL
ALT: 28 U/L (ref 0–44)
AST: 19 U/L (ref 15–41)
Albumin: 4.8 g/dL (ref 3.5–5.0)
Alkaline Phosphatase: 74 U/L (ref 38–126)
Anion gap: 9 (ref 5–15)
BUN: 20 mg/dL (ref 6–20)
CO2: 26 mmol/L (ref 22–32)
Calcium: 9.3 mg/dL (ref 8.9–10.3)
Chloride: 105 mmol/L (ref 98–111)
Creatinine, Ser: 1.1 mg/dL (ref 0.61–1.24)
GFR, Estimated: >60 mL/min (ref >60)
Glucose, Bld: 142 mg/dL — ABNORMAL HIGH (ref 70–99)
Potassium: 4.6 mmol/L (ref 3.5–5.1)
Sodium: 140 mmol/L (ref 135–145)
Total Bilirubin: 0.5 mg/dL (ref 0.3–1.2)
Total Protein: 7.1 g/dL (ref 6.5–8.1)

## 2021-05-03 LAB — RESP PANEL BY RT-PCR (FLU A&B, COVID) ARPGX2
Influenza A by PCR: NEGATIVE
Influenza B by PCR: NEGATIVE
SARS Coronavirus 2 by RT PCR: NEGATIVE

## 2021-05-03 LAB — LACTIC ACID, PLASMA: Lactic Acid, Venous: 1.7 mmol/L (ref 0.5–1.9)

## 2021-05-03 MED ORDER — LORAZEPAM 2 MG/ML IJ SOLN
1.0000 mg | Freq: Once | INTRAMUSCULAR | Status: AC
  Administered 2021-05-04: 1 mg via INTRAVENOUS
  Filled 2021-05-03: qty 1

## 2021-05-03 MED ORDER — HYDROMORPHONE HCL 1 MG/ML IJ SOLN
1.0000 mg | Freq: Once | INTRAMUSCULAR | Status: AC
  Administered 2021-05-03: 22:00:00 1 mg via INTRAVENOUS
  Filled 2021-05-03: qty 1

## 2021-05-03 MED ORDER — HYDROMORPHONE HCL 1 MG/ML IJ SOLN
1.0000 mg | Freq: Once | INTRAMUSCULAR | Status: AC
  Administered 2021-05-03: 19:00:00 1 mg via INTRAVENOUS
  Filled 2021-05-03: qty 1

## 2021-05-03 MED ORDER — HYDROMORPHONE HCL 1 MG/ML IJ SOLN
1.0000 mg | Freq: Once | INTRAMUSCULAR | Status: AC
  Administered 2021-05-03: 23:00:00 1 mg via INTRAVENOUS
  Filled 2021-05-03: qty 1

## 2021-05-03 MED ORDER — METHOCARBAMOL 500 MG PO TABS
500.0000 mg | ORAL_TABLET | Freq: Once | ORAL | Status: AC
  Administered 2021-05-03: 23:00:00 500 mg via ORAL
  Filled 2021-05-03: qty 1

## 2021-05-03 MED ORDER — HYDROMORPHONE HCL 1 MG/ML IJ SOLN
1.0000 mg | Freq: Once | INTRAMUSCULAR | Status: AC
  Administered 2021-05-03: 18:00:00 1 mg via INTRAVENOUS
  Filled 2021-05-03: qty 1

## 2021-05-03 NOTE — ED Provider Notes (Signed)
[facility] EMERGENCY DEPT Provider Note   CSN: [id number] Arrival date & time: 05/03/21  1548     History Chief Complaint  Patient presents with   Back Pain    Gregory Fry is a 25 y.o. male.  The history is provided by the patient, medical records and a parent. No language interpreter was used.  Back Pain Location:  Lumbar spine Quality:  Aching Pain severity:  Severe Pain is:  Same all the time Onset quality:  Gradual Duration:  1 week Timing:  Constant Progression:  Worsening Chronicity:  New Relieved by:  Nothing Worsened by:  Palpation and movement Ineffective treatments:  None tried Associated symptoms: bladder incontinence, bowel incontinence, numbness, paresthesias, tingling and weakness   Associated symptoms: no abdominal pain, no chest pain, no dysuria, no fever (chills present) and no headaches       Past Medical History:  Diagnosis Date   History of kidney stones     There are no problems to display for this patient.   Past Surgical History:  Procedure Laterality Date   EUSTACHIAN TUBE DILATION Bilateral    x4   WRIST FRACTURE SURGERY Right        Family History  Problem Relation Age of Onset   Bladder Cancer Neg Hx    Kidney cancer Neg Hx    Prostate cancer Neg Hx     Social History   Tobacco Use   Smoking status: Never   Smokeless tobacco: Never  Substance Use Topics   Alcohol use: No   Drug use: Not Currently    Types: Marijuana    Home Medications Prior to Admission medications   Medication Sig Start Date End Date Taking? Authorizing Provider  lisdexamfetamine (VYVANSE) 30 MG capsule Take 30 mg by mouth daily.   Yes [provider]  docusate sodium (COLACE) 100 MG capsule Take 1 tablet once or twice daily as needed for constipation while taking narcotic pain medicine 03/12/19   [name], MD  ibuprofen (ADVIL,MOTRIN) 800 MG tablet Take 1 tablet (800 mg total) by mouth every 8 (eight) hours as  needed. 02/26/16   Charlynne Pander, MD  ondansetron (ZOFRAN ODT) 4 MG disintegrating tablet Allow 1-2 tablets to dissolve in your mouth every 8 hours as needed for nausea/vomiting 03/12/19   [name], MD  oxyCODONE-acetaminophen (PERCOCET) 5-325 MG tablet Take 2 tablets by mouth every 6 (six) hours as needed for severe pain. 03/12/19   [name], MD  tamsulosin (FLOMAX) 0.4 MG CAPS capsule Take 1 tablet by mouth daily until you pass the kidney stone or no longer have symptoms 03/12/19   [name], MD    Allergies    Patient has no known allergies.  Review of Systems   Review of Systems  Constitutional:  Positive for chills. Negative for diaphoresis, fatigue and fever (chills present).  HENT:  Negative for congestion.   Respiratory:  Negative for cough, chest tightness, shortness of breath and wheezing.   Cardiovascular:  Negative for chest pain.  Gastrointestinal:  Positive for bowel incontinence. Negative for abdominal pain, constipation, diarrhea, nausea and vomiting.  Genitourinary:  Positive for bladder incontinence. Negative for dysuria, flank pain and frequency.  Musculoskeletal:  Positive for back pain. Negative for neck pain and neck stiffness.  Skin:  Negative for rash and wound.  Neurological:  Positive for tingling, weakness, numbness and paresthesias. Negative for headaches.  Psychiatric/Behavioral:  Negative for agitation and confusion.   All other systems reviewed and are negative.  Physical Exam Updated Vital Signs BP (!) 162/103 (BP Location: Left Arm)    Pulse (!) 109    Temp 97.6 F (36.4 C) (Temporal)    Resp 20    Ht  (1.905 m)    Wt (!) 142.9 kg    SpO2 96%    BMI 39.37 kg/m   Physical Exam Vitals and nursing note reviewed.  Constitutional:      General: He is not in acute distress.    Appearance: He is well-developed.  HENT:     Head: Normocephalic and atraumatic.  Eyes:     Conjunctiva/sclera: Conjunctivae normal.     Pupils: Pupils are  equal, round, and reactive to light.  Cardiovascular:     Rate and Rhythm: Normal rate and regular rhythm.     Heart sounds: No murmur heard. Pulmonary:     Effort: Pulmonary effort is normal. No respiratory distress.     Breath sounds: Normal breath sounds. No wheezing, rhonchi or rales.  Chest:     Chest wall: No tenderness.  Abdominal:     General: Abdomen is flat.     Palpations: Abdomen is soft.     Tenderness: There is no abdominal tenderness. There is no guarding or rebound.  Musculoskeletal:        General: Tenderness present. No swelling.     Cervical back: Neck supple.     Lumbar back: Tenderness present.       Back:  Skin:    General: Skin is warm and dry.     Capillary Refill: Capillary refill takes less than 2 seconds.     Findings: No erythema.  Neurological:     Mental Status: He is alert.     Sensory: Sensory deficit present.     Motor: Weakness present.     Comments: Numbness nad weakness in L leg compared to R. Back tenderness.   Psychiatric:        Mood and Affect: Mood normal.    ED Results / Procedures / Treatments   Labs (all labs ordered are listed, but only abnormal results are displayed) Labs Reviewed  CBC WITH DIFFERENTIAL/PLATELET - Abnormal; Notable for the following components:      Result Value   WBC 15.4 (*)    Neutro Abs 13.3 (*)    Abs Immature Granulocytes 0.11 (*)    All other components within normal limits  COMPREHENSIVE METABOLIC PANEL - Abnormal; Notable for the following components:   Glucose, Bld 142 (*)    All other components within normal limits  URINALYSIS, ROUTINE W REFLEX MICROSCOPIC - Abnormal; Notable for the following components:   APPearance HAZY (*)    Hgb urine dipstick TRACE (*)    All other components within normal limits  RESP PANEL BY RT-PCR (FLU A&B, COVID) ARPGX2  URINE CULTURE  CULTURE, BLOOD (ROUTINE X 2)  CULTURE, BLOOD (ROUTINE X 2)  LACTIC ACID, PLASMA  LACTIC ACID, PLASMA     EKG None  Radiology DG Orbits  Result Date: 05/03/2021 CLINICAL DATA:  Rule out foreign body for MRI. EXAM: ORBITS - COMPLETE 4+ VIEW COMPARISON:  None. FINDINGS: No radiopaque foreign object identified. IMPRESSION: No radiopaque foreign object identified. Electronically Signed   By: Elgie Collard M.D.   On: 05/03/2021 23:09    Procedures Procedures   Medications Ordered in ED Medications  HYDROmorphone (DILAUDID) injection 1 mg (has no administration in time range)  LORazepam (ATIVAN) injection 1 mg (has no administration in time range)  HYDROmorphone (DILAUDID) injection 1 mg (1 mg Intravenous Given 05/03/21 1759)  HYDROmorphone (DILAUDID) injection 1 mg (1 mg Intravenous Given 05/03/21 1922)  HYDROmorphone (DILAUDID) injection 1 mg (1 mg Intravenous Given 05/03/21 2143)  HYDROmorphone (DILAUDID) injection 1 mg (1 mg Intravenous Given 05/03/21 2326)  methocarbamol (ROBAXIN) tablet 500 mg (500 mg Oral Given 05/03/21 2326)  LORazepam (ATIVAN) injection 1 mg (1 mg Intravenous Given 05/04/21 0010)    ED Course  I have reviewed the triage vital signs and the nursing notes.  Pertinent labs & imaging results that were available during my care of the patient were reviewed by me and considered in my medical decision making (see chart for details).  Clinical Course as of 05/04/21 0214  Mon May 04, 2021  0153 Patient able to ambulate to the bathroom with the assistance of crutches. Continues to appear uncomfortable, unable to lay still in the bed. Plan to repeat dose with Ativan and Dilaudid prior to 2nd attempt at MRI. If unable to obtain imaging, may necessitate admission for pain control and deeper sedation for imaging completion. [KH]    Clinical Course User Index [KH] Antony Madura, PA-C   MDM Rules/Calculators/A&P                          Gregory Fry is a 25 y.o. male with a past medical history significant for previous kidney stone as well as previous L5 herniation  with radicular symptoms status post recent epidural injection 4 days ago who presents with worsened back pain, subjective chills/fevers, new incontinence of bowel and bladder, and worsening left leg numbness and weakness.  Patient is coming by mother.  According to patient and mother, patient has had back pain for the last year or so and was found to have L5 troubles.  He reports that just over a week ago he had some gradual worsening of his back pain and had called to get seen again by his back doctor.  He says that he saw Dr. Darrelyn Hillock on Thursday and had an epidural injection done but since then has had worsening pain.  He describes as a 9 out of 10 pain in his mid low back worse on the left side and radiating downward.  He also reports that he is dragging his leg worse with more weakness and is now more numbness in the leg.  He also says that he has today had an episode of both bowel and bladder incontinence that he is worried was not just because he could not get to the bathroom in time.  He denies any new trauma.  He has been using the medications previously prescribed without success.  He reports no URI symptoms with no cough, congestion, nausea, vomiting, constipation, or diarrhea but does report some subjective fevers and chills.  Due to the new red flags with incontinence and worsened numbness and weakness he presents for evaluation.  On exam, patient does indeed have some weakness in the left leg for the right as well as some tingling numbness.  He had intact pulses distally and did not have any actual leg tenderness on my exam.  He did have tenderness in his mid and low back worse on the left side.  Otherwise lungs clear and chest nontender.  Abdomen nontender.  Given the patient's known back troubles and the worsening left leg numbness and weakness with some noticeable droop per family and the left leg being dragged, I am concerned he may  need some imaging.  I am concerned we do need to rule out cauda  equina from either progression of his disease versus a epidural bleed or hematoma from the recent epidural injection versus infection.  With his subjective chills, I do feel that MRI is needed and we will get an MRI with and without contrast of the thoracic and lumbar spine to look for a surgical problem.  We will get screening labs.  As he is afebrile here, will await work-up results prior to initiation of antibiotics if he needs them.  Spoke to Dr. Rhunette Croft at [facility] who accept the patient for ED to ED transfer for MRI with and without of the thoracic and lumbar spine.  Anticipate disposition based on these results.  Family is in agreement with plan and patient received several doses of pain medicine help with discomfort prior to transfer.  Patient transferred in stable condition.      Final Clinical Impression(s) / ED Diagnoses Final diagnoses:  Acute low back pain, unspecified back pain laterality, unspecified whether sciatica present  Left leg weakness  Left leg numbness     Clinical Impression: 1. Acute low back pain, unspecified back pain laterality, unspecified whether sciatica present   2. Left leg weakness   3. Left leg numbness     Disposition: Care transferred while awaiting labs and MRI then reassessment.   This note was prepared with assistance of Conservation officer, historic buildings. Occasional wrong-word or sound-a-like substitutions may have occurred due to the inherent limitations of voice recognition software.     Londen Bok, Canary Brim, MD 05/04/21 [phone]

## 2021-05-03 NOTE — ED Notes (Signed)
Pt reports receiving a steroid injection to his lower back Thursday. Pt also reports some sweating or feeling feverish during the night x2. This started after receiving his lumbar injection on Thursday. Pt has been seeing Dr. Juliene Pina for several months for lumbar pain and numbness.

## 2021-05-03 NOTE — ED Notes (Signed)
Kim with [facility] called for ED to ED for MRI 18:46

## 2021-05-03 NOTE — ED Notes (Signed)
Pt standing, does not want to get in the bed d/t increase pain

## 2021-05-03 NOTE — ED Triage Notes (Signed)
Pt with hx of L5 herniation approximately one year ago. Pt with newly worsening lower back pain and difficulty with ambulation. Pt denies trauma. Pt states he has had one episode of incontinence.

## 2021-05-03 NOTE — ED Notes (Signed)
Patient transported to MRI 

## 2021-05-03 NOTE — ED Provider Notes (Signed)
[facility] EMERGENCY DEPARTMENT Provider Note   CSN: [id number] Arrival date & time: 05/03/21  1548     History Chief Complaint  Patient presents with   Back Pain    Gregory Fry is a 25 y.o. male.  With past medical history of nephrolithiasis, L5 herniation with radiculopathy who presented to [facility] emergency department this afternoon with low back pain.  Patient states that he had epidural injection by Dr. Darrelyn Fry Thursday and has since had increasing low back pain, fever, incontinence of bowel and worsening left leg weakness.  At [facility] patient reported that he had back pain for the last year.  He states that he has been doing okay until about a week ago when he had gradual worsening of his back pain and called his doctor.  He states that since his injection he has had 9/10 mid low back pain that is worse on the left side.  He reports to me that after he returned home from his injection he had difficulty walking.  He states that he has only been able to find positions of comfort sitting on his knees on the floor.  He noticed that it was taking him longer to get up and he was having increasing pain up until Saturday morning when he states that he could not move.  He he states that Saturday morning he also had an episode of incontinence of bowel.  He states that he "almost had a incontinence of bladder" however he was able to get the bathroom in time.  He reports no further incidence of incontinence.  He denies saddle anesthesia.  He denies falls.  He was ED to ED transfer for MRI with and without of the thoracic and lumbar spine.   Back Pain Associated symptoms: fever, numbness and weakness       Past Medical History:  Diagnosis Date   History of kidney stones     There are no problems to display for this patient.   Past Surgical History:  Procedure Laterality Date   EUSTACHIAN TUBE DILATION Bilateral    x4   WRIST FRACTURE SURGERY Right         Family History  Problem Relation Age of Onset   Bladder Cancer Neg Hx    Kidney cancer Neg Hx    Prostate cancer Neg Hx     Social History   Tobacco Use   Smoking status: Never   Smokeless tobacco: Never  Substance Use Topics   Alcohol use: No   Drug use: Not Currently    Types: Marijuana    Home Medications Prior to Admission medications   Medication Sig Start Date End Date Taking? Authorizing Provider  lisdexamfetamine (VYVANSE) 30 MG capsule Take 30 mg by mouth daily.   Yes [provider]  docusate sodium (COLACE) 100 MG capsule Take 1 tablet once or twice daily as needed for constipation while taking narcotic pain medicine 03/12/19   Gregory Rose, MD  ibuprofen (ADVIL,MOTRIN) 800 MG tablet Take 1 tablet (800 mg total) by mouth every 8 (eight) hours as needed. 02/26/16   Gregory Pander, MD  ondansetron (ZOFRAN ODT) 4 MG disintegrating tablet Allow 1-2 tablets to dissolve in your mouth every 8 hours as needed for nausea/vomiting 03/12/19   Gregory Rose, MD  oxyCODONE-acetaminophen (PERCOCET) 5-325 MG tablet Take 2 tablets by mouth every 6 (six) hours as needed for severe pain. 03/12/19   Gregory Rose, MD  tamsulosin (FLOMAX) 0.4 MG CAPS capsule Take 1  tablet by mouth daily until you pass the kidney stone or no longer have symptoms 03/12/19   Gregory Rose, MD    Allergies    Patient has no known allergies.  Review of Systems   Review of Systems  Constitutional:  Positive for fever.  Gastrointestinal:        Incontinence of bowel  Musculoskeletal:  Positive for back pain and gait problem.  Neurological:  Positive for weakness and numbness.  All other systems reviewed and are negative.  Physical Exam Updated Vital Signs BP (!) 152/105 (BP Location: Right Arm)    Pulse 81    Temp 97.7 F (36.5 C) (Oral)    Resp 18    Ht 6\' 3"  (1.905 m)    Wt (!) 142.9 kg    SpO2 99%    BMI 39.37 kg/m   Physical Exam Vitals and nursing note reviewed.   Constitutional:      Appearance: Normal appearance.     Comments: He does appear somewhat diaphoretic on my exam.  He is sitting in the chair next to the bed and leaned over the bed in position of comfort.  He is in no acute distress  HENT:     Head: Normocephalic and atraumatic.  Eyes:     General: No scleral icterus.    Extraocular Movements: Extraocular movements intact.     Pupils: Pupils are equal, round, and reactive to light.  Cardiovascular:     Pulses: Normal pulses.  Pulmonary:     Effort: Pulmonary effort is normal. No respiratory distress.     Breath sounds: Normal breath sounds.  Abdominal:     Palpations: Abdomen is soft.  Musculoskeletal:     Cervical back: Normal range of motion and neck supple. No tenderness.     Comments: 4/5 strength in left lower extremity.  5/5 strength in right lower extremity.  Skin:    General: Skin is warm and dry.     Capillary Refill: Capillary refill takes less than 2 seconds.     Findings: No rash.  Neurological:     Mental Status: He is alert and oriented to person, place, and time.     Sensory: Sensation is intact.     Motor: Weakness present. No tremor.     Comments: Limping gait  Psychiatric:        Mood and Affect: Mood normal.        Behavior: Behavior normal.        Thought Content: Thought content normal.        Judgment: Judgment normal.    ED Results / Procedures / Treatments   Labs (all labs ordered are listed, but only abnormal results are displayed) Labs Reviewed  CBC WITH DIFFERENTIAL/PLATELET - Abnormal; Notable for the following components:      Result Value   WBC 15.4 (*)    Neutro Abs 13.3 (*)    Abs Immature Granulocytes 0.11 (*)    All other components within normal limits  COMPREHENSIVE METABOLIC PANEL - Abnormal; Notable for the following components:   Glucose, Bld 142 (*)    All other components within normal limits  URINALYSIS, ROUTINE W REFLEX MICROSCOPIC - Abnormal; Notable for the following  components:   APPearance HAZY (*)    Hgb urine dipstick TRACE (*)    All other components within normal limits  RESP PANEL BY RT-PCR (FLU A&B, COVID) ARPGX2  URINE CULTURE  CULTURE, BLOOD (ROUTINE X 2)  CULTURE, BLOOD (ROUTINE X 2)  LACTIC ACID, PLASMA  LACTIC ACID, PLASMA    EKG None  Radiology DG Orbits  Result Date: 05/03/2021 CLINICAL DATA:  Rule out foreign body for MRI. EXAM: ORBITS - COMPLETE 4+ VIEW COMPARISON:  None. FINDINGS: No radiopaque foreign object identified. IMPRESSION: No radiopaque foreign object identified. Electronically Signed   By: Elgie Collard M.D.   On: 05/03/2021 23:09    Procedures Procedures   Medications Ordered in ED Medications  HYDROmorphone (DILAUDID) injection 1 mg (1 mg Intravenous Given 05/03/21 1759)  HYDROmorphone (DILAUDID) injection 1 mg (1 mg Intravenous Given 05/03/21 1922)  HYDROmorphone (DILAUDID) injection 1 mg (1 mg Intravenous Given 05/03/21 2143)  HYDROmorphone (DILAUDID) injection 1 mg (1 mg Intravenous Given 05/03/21 2326)  methocarbamol (ROBAXIN) tablet 500 mg (500 mg Oral Given 05/03/21 2326)   ED Course  I have reviewed the triage vital signs and the nursing notes.  Pertinent labs & imaging results that were available during my care of the patient were reviewed by me and considered in my medical decision making (see chart for details).    MDM Rules/Calculators/A&P 25 year old male who presents to the emergency department with acute low back pain.  Patient presents as a transfer for rule out cauda equina syndrome.  Differential also includes epidural bleed, hematoma or infection (has had subjective chills, and diaphoretic on presentation to [facility].)   -WBC 15.4, with subjective chills concerning for possible infection versus reactive. 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 Orbit imaging without foreign body. Patient having  significant pain with laying down, redosed with Dilaudid and Robaxin at this time. - MRI w and wo of T and L spine pending at time of handoff.  Disposition pending imaging.  If imaging is normal patient can be discharged with pain management and follow-up with neurology.  Care of patient handed off to [facility] [place].  Final Clinical Impression(s) / ED Diagnoses Final diagnoses:  Acute low back pain, unspecified back pain laterality, unspecified whether sciatica present  Left leg weakness  Left leg numbness    Rx / DC Orders ED Discharge Orders     None        Cristopher Peru, PA-C 05/03/21 2338    Derwood Kaplan, MD 05/03/21 2352

## 2021-05-03 NOTE — ED Notes (Signed)
[facility] at Bedside. 

## 2021-05-04 ENCOUNTER — Emergency Department (HOSPITAL_COMMUNITY): Payer: BC Managed Care – PPO

## 2021-05-04 ENCOUNTER — Observation Stay (HOSPITAL_COMMUNITY): Payer: BC Managed Care – PPO | Admitting: Anesthesiology

## 2021-05-04 ENCOUNTER — Encounter (HOSPITAL_COMMUNITY): Payer: Self-pay | Admitting: Internal Medicine

## 2021-05-04 ENCOUNTER — Observation Stay (HOSPITAL_COMMUNITY): Payer: BC Managed Care – PPO

## 2021-05-04 ENCOUNTER — Encounter (HOSPITAL_COMMUNITY)
Admission: EM | Disposition: A | Discharge status: Home / Self Care | Payer: Self-pay | Source: Home / Self Care | Attending: Internal Medicine

## 2021-05-04 DIAGNOSIS — F909 Attention-deficit hyperactivity disorder, unspecified type: Secondary | ICD-10-CM | POA: Diagnosis present

## 2021-05-04 DIAGNOSIS — E669 Obesity, unspecified: Secondary | ICD-10-CM

## 2021-05-04 DIAGNOSIS — M5442 Lumbago with sciatica, left side: Secondary | ICD-10-CM

## 2021-05-04 DIAGNOSIS — Z79899 Other long term (current) drug therapy: Secondary | ICD-10-CM | POA: Diagnosis not present

## 2021-05-04 DIAGNOSIS — Z6839 Body mass index (BMI) 39.0-39.9, adult: Secondary | ICD-10-CM | POA: Diagnosis not present

## 2021-05-04 DIAGNOSIS — Z20822 Contact with and (suspected) exposure to covid-19: Secondary | ICD-10-CM | POA: Diagnosis present

## 2021-05-04 DIAGNOSIS — R030 Elevated blood-pressure reading, without diagnosis of hypertension: Secondary | ICD-10-CM | POA: Diagnosis present

## 2021-05-04 DIAGNOSIS — M48061 Spinal stenosis, lumbar region without neurogenic claudication: Secondary | ICD-10-CM | POA: Diagnosis present

## 2021-05-04 DIAGNOSIS — N200 Calculus of kidney: Secondary | ICD-10-CM | POA: Insufficient documentation

## 2021-05-04 DIAGNOSIS — M5127 Other intervertebral disc displacement, lumbosacral region: Secondary | ICD-10-CM

## 2021-05-04 DIAGNOSIS — K5903 Drug induced constipation: Secondary | ICD-10-CM | POA: Diagnosis not present

## 2021-05-04 DIAGNOSIS — R29898 Other symptoms and signs involving the musculoskeletal system: Secondary | ICD-10-CM | POA: Diagnosis present

## 2021-05-04 DIAGNOSIS — T402X5A Adverse effect of other opioids, initial encounter: Secondary | ICD-10-CM | POA: Diagnosis not present

## 2021-05-04 DIAGNOSIS — M545 Low back pain, unspecified: Secondary | ICD-10-CM | POA: Diagnosis present

## 2021-05-04 DIAGNOSIS — R32 Unspecified urinary incontinence: Secondary | ICD-10-CM | POA: Diagnosis present

## 2021-05-04 DIAGNOSIS — D72829 Elevated white blood cell count, unspecified: Secondary | ICD-10-CM | POA: Diagnosis present

## 2021-05-04 HISTORY — PX: RADIOLOGY WITH ANESTHESIA: SHX6223

## 2021-05-04 LAB — URINE CULTURE: Culture: NO GROWTH

## 2021-05-04 SURGERY — MRI WITH ANESTHESIA
Anesthesia: General

## 2021-05-04 MED ORDER — KETOROLAC TROMETHAMINE 30 MG/ML IJ SOLN
30.0000 mg | Freq: Three times a day (TID) | INTRAMUSCULAR | Status: DC
  Administered 2021-05-04 – 2021-05-05 (×3): 30 mg via INTRAVENOUS
  Filled 2021-05-04 (×5): qty 1

## 2021-05-04 MED ORDER — POLYETHYLENE GLYCOL 3350 17 G PO PACK
17.0000 g | PACK | Freq: Every day | ORAL | Status: DC
  Administered 2021-05-04 – 2021-05-05 (×2): 17 g via ORAL
  Filled 2021-05-04 (×2): qty 1

## 2021-05-04 MED ORDER — DOCUSATE SODIUM 100 MG PO CAPS
100.0000 mg | ORAL_CAPSULE | Freq: Two times a day (BID) | ORAL | Status: DC
  Administered 2021-05-05: 08:00:00 100 mg via ORAL
  Filled 2021-05-04 (×2): qty 1

## 2021-05-04 MED ORDER — MELATONIN 3 MG PO TABS
3.0000 mg | ORAL_TABLET | Freq: Every evening | ORAL | Status: DC | PRN
  Administered 2021-05-05: 3 mg via ORAL
  Filled 2021-05-04: qty 1

## 2021-05-04 MED ORDER — HYDROMORPHONE HCL 1 MG/ML IJ SOLN
0.5000 mg | INTRAMUSCULAR | Status: DC | PRN
  Administered 2021-05-04 – 2021-05-05 (×2): 0.5 mg via INTRAVENOUS
  Filled 2021-05-04 (×2): qty 0.5

## 2021-05-04 MED ORDER — LORAZEPAM 2 MG/ML IJ SOLN
1.0000 mg | Freq: Once | INTRAMUSCULAR | Status: AC
  Administered 2021-05-04: 03:00:00 1 mg via INTRAVENOUS
  Filled 2021-05-04: qty 1

## 2021-05-04 MED ORDER — ONDANSETRON HCL 4 MG/2ML IJ SOLN
INTRAMUSCULAR | Status: DC | PRN
  Administered 2021-05-04: 4 mg via INTRAVENOUS

## 2021-05-04 MED ORDER — FENTANYL 12 MCG/HR TD PT72
1.0000 | MEDICATED_PATCH | TRANSDERMAL | Status: DC
  Administered 2021-05-04: 18:00:00 1 via TRANSDERMAL
  Filled 2021-05-04: qty 1

## 2021-05-04 MED ORDER — ORAL CARE MOUTH RINSE: 15.0000 mL | Freq: Once | OROMUCOSAL | Status: AC

## 2021-05-04 MED ORDER — DEXAMETHASONE SODIUM PHOSPHATE 10 MG/ML IJ SOLN
10.0000 mg | Freq: Once | INTRAMUSCULAR | Status: AC
  Administered 2021-05-04: 04:00:00 10 mg via INTRAVENOUS
  Filled 2021-05-04: qty 1

## 2021-05-04 MED ORDER — HYDROMORPHONE HCL 1 MG/ML IJ SOLN
1.0000 mg | Freq: Once | INTRAMUSCULAR | Status: AC
  Administered 2021-05-04: 03:00:00 1 mg via INTRAVENOUS
  Filled 2021-05-04: qty 1

## 2021-05-04 MED ORDER — CHLORHEXIDINE GLUCONATE 0.12 % MT SOLN
OROMUCOSAL | Status: AC
  Administered 2021-05-04: 10:00:00 15 mL via OROMUCOSAL
  Filled 2021-05-04: qty 15

## 2021-05-04 MED ORDER — FENTANYL CITRATE (PF) 100 MCG/2ML IJ SOLN
50.0000 ug | Freq: Once | INTRAMUSCULAR | Status: AC
  Administered 2021-05-04: 12:00:00 50 ug via INTRAVENOUS

## 2021-05-04 MED ORDER — PROPOFOL 10 MG/ML IV BOLUS
INTRAVENOUS | Status: DC | PRN
  Administered 2021-05-04: 100 mg via INTRAVENOUS
  Administered 2021-05-04: 50 mg via INTRAVENOUS
  Administered 2021-05-04: 300 mg via INTRAVENOUS

## 2021-05-04 MED ORDER — DEXAMETHASONE SODIUM PHOSPHATE 10 MG/ML IJ SOLN
8.0000 mg | Freq: Two times a day (BID) | INTRAMUSCULAR | Status: DC
  Administered 2021-05-04 – 2021-05-05 (×2): 8 mg via INTRAVENOUS
  Filled 2021-05-04 (×2): qty 1

## 2021-05-04 MED ORDER — HYDRALAZINE HCL 10 MG PO TABS
10.0000 mg | ORAL_TABLET | Freq: Three times a day (TID) | ORAL | Status: DC
  Administered 2021-05-04: 18:00:00 10 mg via ORAL
  Filled 2021-05-04: qty 1

## 2021-05-04 MED ORDER — ONDANSETRON HCL 4 MG/2ML IJ SOLN
4.0000 mg | Freq: Once | INTRAMUSCULAR | Status: AC
  Administered 2021-05-04: 05:00:00 4 mg via INTRAVENOUS
  Filled 2021-05-04: qty 2

## 2021-05-04 MED ORDER — HYDROMORPHONE HCL 1 MG/ML IJ SOLN
0.5000 mg | INTRAMUSCULAR | Status: DC | PRN
  Administered 2021-05-04 (×2): 0.5 mg via INTRAVENOUS
  Filled 2021-05-04: qty 1
  Filled 2021-05-04: qty 0.5

## 2021-05-04 MED ORDER — FENTANYL CITRATE (PF) 100 MCG/2ML IJ SOLN
INTRAMUSCULAR | Status: AC
  Filled 2021-05-04: qty 2

## 2021-05-04 MED ORDER — LIDOCAINE 2% (20 MG/ML) 5 ML SYRINGE
INTRAMUSCULAR | Status: DC | PRN
  Administered 2021-05-04: 60 mg via INTRAVENOUS

## 2021-05-04 MED ORDER — FENTANYL CITRATE (PF) 100 MCG/2ML IJ SOLN
INTRAMUSCULAR | Status: AC
  Administered 2021-05-04: 11:00:00 50 ug via INTRAVENOUS
  Filled 2021-05-04: qty 2

## 2021-05-04 MED ORDER — FENTANYL CITRATE (PF) 100 MCG/2ML IJ SOLN: 50.0000 ug | Freq: Once | INTRAMUSCULAR | Status: AC

## 2021-05-04 MED ORDER — MIDAZOLAM HCL 2 MG/2ML IJ SOLN
INTRAMUSCULAR | Status: DC | PRN
  Administered 2021-05-04: 2 mg via INTRAVENOUS

## 2021-05-04 MED ORDER — KETAMINE HCL 50 MG/5ML IJ SOSY
0.3000 mg/kg | PREFILLED_SYRINGE | Freq: Once | INTRAMUSCULAR | Status: AC
  Administered 2021-05-04: 05:00:00 43 mg via INTRAVENOUS
  Filled 2021-05-04: qty 5

## 2021-05-04 MED ORDER — CHLORHEXIDINE GLUCONATE 0.12 % MT SOLN
15.0000 mL | Freq: Once | OROMUCOSAL | Status: AC
  Filled 2021-05-04: qty 15

## 2021-05-04 MED ORDER — METHOCARBAMOL 1000 MG/10ML IJ SOLN
500.0000 mg | Freq: Four times a day (QID) | INTRAVENOUS | Status: DC | PRN
  Administered 2021-05-04: 16:00:00 500 mg via INTRAVENOUS
  Filled 2021-05-04 (×3): qty 5

## 2021-05-04 MED ORDER — MIDAZOLAM HCL 2 MG/2ML IJ SOLN
INTRAMUSCULAR | Status: AC
  Filled 2021-05-04: qty 2

## 2021-05-04 MED ORDER — GADOBUTROL 1 MMOL/ML IV SOLN
10.0000 mL | Freq: Once | INTRAVENOUS | Status: AC | PRN
  Administered 2021-05-04: 14:00:00 10 mL via INTRAVENOUS

## 2021-05-04 MED ORDER — FENTANYL CITRATE (PF) 100 MCG/2ML IJ SOLN
25.0000 ug | INTRAMUSCULAR | Status: DC | PRN
  Administered 2021-05-04 (×2): 50 ug via INTRAVENOUS

## 2021-05-04 MED ORDER — LACTATED RINGERS IV SOLN: INTRAVENOUS | Status: DC

## 2021-05-04 MED ORDER — IOPAMIDOL (ISOVUE-370) INJECTION 76%
100.0000 mL | Freq: Once | INTRAVENOUS | Status: AC | PRN
  Administered 2021-05-04: 05:00:00 100 mL via INTRAVENOUS

## 2021-05-04 MED ORDER — DEXTROSE IN LACTATED RINGERS 5 % IV SOLN: INTRAVENOUS | Status: AC

## 2021-05-04 MED ORDER — LIDOCAINE 5 % EX PTCH
1.0000 | MEDICATED_PATCH | CUTANEOUS | Status: DC
  Administered 2021-05-05: 08:00:00 1 via TRANSDERMAL
  Filled 2021-05-04: qty 1

## 2021-05-04 NOTE — Assessment & Plan Note (Addendum)
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  Orthopedics consulted.  Since his Orthopedist does not do surgery, on-call Orthopedist does not do spinal procedures, so case referred to neurosurgery.  Neurosurgery reviewed MRI's and no acute findings.  Recommendation is for pain control and steroids and they will see him in follow-up.  Will start with IV steroids and for pain control, will try combination of toradol, IV Dilaudid, lidocaine and fentanyl patch.

## 2021-05-04 NOTE — Assessment & Plan Note (Signed)
Meets criteria BMI greater than 30 

## 2021-05-04 NOTE — Anesthesia Postprocedure Evaluation (Signed)
Anesthesia Post Note  Patient: Gregory Fry  Procedure(s) Performed: MRI WITH ANESTHESIA     Patient location during evaluation: PACU Anesthesia Type: General Level of consciousness: awake Pain management: pain level controlled Vital Signs Assessment: post-procedure vital signs reviewed and stable Respiratory status: spontaneous breathing, nonlabored ventilation, respiratory function stable and patient connected to nasal cannula oxygen Cardiovascular status: blood pressure returned to baseline and stable Postop Assessment: no apparent nausea or vomiting Anesthetic complications: no   No notable events documented.  Last Vitals:  Vitals:   05/04/21 1445 05/04/21 1459  BP: (!) 161/96 (!) 148/83  Pulse: 83 90  Resp: 13 17  Temp:  36.9 C  SpO2: 100% 100%    Last Pain:  Vitals:   05/04/21 1619  TempSrc:   PainSc: 2    Pain Goal: Patients Stated Pain Goal: 2 (05/04/21 0952)                 Catheryn Bacon Jenniferann Stuckert

## 2021-05-04 NOTE — Anesthesia Preprocedure Evaluation (Addendum)
Anesthesia Evaluation  Patient identified by MRN, date of birth, ID band Patient awake    Reviewed: Allergy & Precautions, NPO status , Patient's Chart, lab work & pertinent test results  Airway Mallampati: II  TM Distance: >3 FB Neck ROM: Full    Dental  (+) Teeth Intact, Dental Advisory Given   Pulmonary neg pulmonary ROS,    Pulmonary exam normal breath sounds clear to auscultation       Cardiovascular Exercise Tolerance: Good negative cardio ROS Normal cardiovascular exam Rhythm:Regular Rate:Normal     Neuro/Psych Low back pain  negative psych ROS   GI/Hepatic negative GI ROS, Neg liver ROS,   Endo/Other  Morbid obesity  Renal/GU negative Renal ROS     Musculoskeletal negative musculoskeletal ROS (+)   Abdominal   Peds  (+) ADHD Hematology negative hematology ROS (+)   Anesthesia Other Findings Day of surgery medications reviewed with the patient.  Reproductive/Obstetrics                            Anesthesia Physical Anesthesia Plan  ASA: 3 and emergent  Anesthesia Plan: General   Post-op Pain Management:    Induction: Intravenous  PONV Risk Score and Plan: 2 and Midazolam, Dexamethasone and Ondansetron  Airway Management Planned: LMA  Additional Equipment:   Intra-op Plan:   Post-operative Plan: Extubation in OR  Informed Consent: I have reviewed the patients History and Physical, chart, labs and discussed the procedure including the risks, benefits and alternatives for the proposed anesthesia with the patient or authorized representative who has indicated his/her understanding and acceptance.     Dental advisory given  Plan Discussed with: CRNA  Anesthesia Plan Comments:        Anesthesia Quick Evaluation

## 2021-05-04 NOTE — Assessment & Plan Note (Signed)
Suspect that this may be more from pain rather than nerve impingement

## 2021-05-04 NOTE — Assessment & Plan Note (Signed)
Clarified history with patient.  Patient did not experience true bowel/bladder incontinence, but rather felt urge, but due to severe pain, this limited his ability to get to the bathroom in time which caused his problems.

## 2021-05-04 NOTE — ED Notes (Signed)
Pt transported to OR [facility] by NT.

## 2021-05-04 NOTE — ED Notes (Signed)
Paged Georges Lynch. Darrelyn Hillock, MD, ortho for MD

## 2021-05-04 NOTE — H&P (Signed)
Anesthesia H&P Update: History and Physical Exam reviewed; patient is OK for planned anesthetic and procedure. ? ?

## 2021-05-04 NOTE — Anesthesia Procedure Notes (Signed)
Procedure Name: LMA Insertion Date/Time: 05/04/2021 12:21 PM Performed by: Katina Degree, CRNA Pre-anesthesia Checklist: Patient identified, Emergency Drugs available, Suction available and Patient being monitored Patient Re-evaluated:Patient Re-evaluated prior to induction Oxygen Delivery Method: Circle system utilized Preoxygenation: Pre-oxygenation with 100% oxygen Induction Type: IV induction Ventilation: Mask ventilation without difficulty LMA: LMA flexible inserted LMA Size: 5.0 Number of attempts: 1 Airway Equipment and Method: Oral airway Placement Confirmation: positive ETCO2 and breath sounds checked- equal and bilateral Tube secured with: Tape Dental Injury: Teeth and Oropharynx as per pre-operative assessment

## 2021-05-04 NOTE — ED Provider Notes (Signed)
Clinical Course as of 05/05/21 7106  [facility] May 04, 2021  0153 Patient able to ambulate to the bathroom with the assistance of crutches. Continues to appear uncomfortable, unable to lay still in the bed. Plan to repeat dose with Ativan and Dilaudid prior to 2nd attempt at MRI. If unable to obtain imaging, may necessitate admission for pain control and deeper sedation for imaging completion. [KH]  [phone] Patient continues to be unable to tolerate MRI despite Ativan and additional dose of Dilaudid.  Given postprocedural infection concerns, we will at least attempt to obtain CT of the thoracic and lumbar spine.  Will give Versed prior to this imaging.  Consult placed to neurosurgery to discuss case.  Anticipate that patient will require medical admission for ongoing management as well as imaging under deep sedation. [KH]  0352 Decadron and ketamine ordered for attempted symptomatic relief. [KH]  [phone] Case discussed between myself, MD Rancour, and Dr. Franky Macho. Dr. Franky Macho requests that Orthopedics be primarily contacted and involved in this patient's care given that he has been managed by [facility] up until this point. [KH]  [phone] Patient updated on plan. On further questioning, patient does report being on a prednisone taper recently. This may account for his leukocytosis given that he has remained afebrile in the ED. CT on way to transport patient for imaging.  [KH]  5009 Case discussed with Dr. Toniann Fail. There was concern about admission of the patient given mobility status as patient may not meet criteria for admission. Requested inquiry as to whether MRI with anesthesia could be completed in the ED prior to disposition.  I spoke with CRNA charge who reports earliest imaging given NPO status would be at 10-11AM; however, their service is not fully staffed given the holiday and the earliest patient would like receive imaging is later in the afternoon. CRNA requested I call to post the patient's case with the  anesthesia secretary to try and facilitate imaging. Loop was also closed with MRI so that on staff RN would be aware of patient's need for imaging completion today.  Aforementioned barriers communicated back to Dr. Toniann Fail. At this time, plan for admission for pain control pending MRI imaging to r/o cauda equina vs abscess vs post-procedural hematoma vs other acute process.  [KH]  0532 Case was discussed between Dr. Odis Hollingshead of [facility]. States that there is no spine specialist from their group on call over the holiday should the patient necessitate spinal intervention. [KH]    Clinical Course User Index [KH] Antony Madura, PA-C    Results for orders placed or performed during the hospital encounter of 05/03/21  Resp Panel by RT-PCR (Flu A&B, Covid)   Specimen: Nasopharyngeal(NP) swabs in vial transport medium  Result Value Ref Range   SARS Coronavirus 2 by RT PCR NEGATIVE NEGATIVE   Influenza A by PCR NEGATIVE NEGATIVE   Influenza B by PCR NEGATIVE NEGATIVE  CBC with Differential  Result Value Ref Range   WBC 15.4 (H) 4.0 - 10.5 K/uL   RBC 5.54 4.22 - 5.81 MIL/uL   Hemoglobin 15.9 13.0 - 17.0 g/dL   HCT 38.1 82.9 - 93.7 %   MCV 83.9 80.0 - 100.0 fL   MCH 28.7 26.0 - 34.0 pg   MCHC 34.2 30.0 - 36.0 g/dL   RDW 16.9 67.8 - 93.8 %   Platelets 292 150 - 400 K/uL   nRBC 0.0 0.0 - 0.2 %   Neutrophils Relative % 86 %   Neutro Abs 13.3 (H)  1.7 - 7.7 K/uL   Lymphocytes Relative 7 %   Lymphs Abs 1.1 0.7 - 4.0 K/uL   Monocytes Relative 6 %   Monocytes Absolute 0.9 0.1 - 1.0 K/uL   Eosinophils Relative 0 %   Eosinophils Absolute 0.0 0.0 - 0.5 K/uL   Basophils Relative 0 %   Basophils Absolute 0.1 0.0 - 0.1 K/uL   Immature Granulocytes 1 %   Abs Immature Granulocytes 0.11 (H) 0.00 - 0.07 K/uL  Comprehensive metabolic panel  Result Value Ref Range   Sodium 140 135 - 145 mmol/L   Potassium 4.6 3.5 - 5.1 mmol/L   Chloride 105 98 - 111 mmol/L   CO2 26 22 - 32 mmol/L   Glucose,  Bld 142 (H) 70 - 99 mg/dL   BUN 20 6 - 20 mg/dL   Creatinine, Ser 1.44 0.61 - 1.24 mg/dL   Calcium 9.3 8.9 - 31.5 mg/dL   Total Protein 7.1 6.5 - 8.1 g/dL   Albumin 4.8 3.5 - 5.0 g/dL   AST 19 15 - 41 U/L   ALT 28 0 - 44 U/L   Alkaline Phosphatase 74 38 - 126 U/L   Total Bilirubin 0.5 0.3 - 1.2 mg/dL   GFR, Estimated >40 >08 mL/min   Anion gap 9 5 - 15  Lactic acid, plasma  Result Value Ref Range   Lactic Acid, Venous 1.7 0.5 - 1.9 mmol/L  Urinalysis, Routine w reflex microscopic  Result Value Ref Range   Color, Urine YELLOW YELLOW   APPearance HAZY (A) CLEAR   Specific Gravity, Urine 1.023 1.005 - 1.030   pH 7.0 5.0 - 8.0   Glucose, UA NEGATIVE NEGATIVE mg/dL   Hgb urine dipstick TRACE (A) NEGATIVE   Bilirubin Urine NEGATIVE NEGATIVE   Ketones, ur NEGATIVE NEGATIVE mg/dL   Protein, ur NEGATIVE NEGATIVE mg/dL   Nitrite NEGATIVE NEGATIVE   Leukocytes,Ua NEGATIVE NEGATIVE   RBC / HPF 11-20 0 - 5 RBC/hpf   Mucus PRESENT    Amorphous Crystal PRESENT    DG Orbits  Result Date: 05/03/2021 CLINICAL DATA:  Rule out foreign body for MRI. EXAM: ORBITS - COMPLETE 4+ VIEW COMPARISON:  None. FINDINGS: No radiopaque foreign object identified. IMPRESSION: No radiopaque foreign object identified. Electronically Signed   By: Elgie Collard M.D.   On: 05/03/2021 23:09      Antony Madura, Cordelia Poche 05/05/21 6761    Glynn Octave, MD 05/06/21 [phone]

## 2021-05-04 NOTE — H&P (Signed)
History and Physical    Gregory Fry [id number] DOB: 12/15/1995 DOA: 05/03/2021  PCP: Ferd Hibbs, NP  Patient coming from: Home.  Chief Complaint: Low back pain.  HPI: Gregory Fry is a 25 y.o. male with history of ADHD who has been having low back pain since April 2022 when he had a fall.  At that time as per the patient he was diagnosed with L5 disc herniation and follows with [facility] with Dr. Gladstone Lighter.  His pain subsequently improved.  But last Saturday about a week ago he started having severe back pain radiating to his left lower extremity.  He had followed up with [facility] the following day and was placed on prednisone taper with plans to have epidural injection.  He got epidural injection on Thursday, April 30, 2021.  Patient as per patient orthopedic surgeon was to follow-up with him next week if pain does not improve for MRI.  Over the last 24 hours patient pain worsened with mild weakness of the left lower extremity and also incontinent of his bowel and urine.  ED Course: In the ER patient has weakness of left lower extremity about 4 x 5.  No saddle anesthesia.  CT thoracic and lumbar spine did not show anything acute.  But did show some chronic changes.  In the ER patient received multiple doses of Dilaudid and also ketamine.  Despite which patient was still having pain.  Plan was to get MRI but patient was not able to lie flat.  Anesthesia was consulted but since patient already had food plan is to keep patient n.p.o. at least for 5 hours before the MRI can be attempted and anesthesia.  COVID test was negative.  Review of Systems: As per HPI, rest all negative.   Past Medical History:  Diagnosis Date   History of kidney stones     Past Surgical History:  Procedure Laterality Date   EUSTACHIAN TUBE DILATION Bilateral    x4   WRIST FRACTURE SURGERY Right      reports that he has never smoked. He has never used smokeless tobacco. He reports that he  does not currently use drugs after having used the following drugs: Marijuana. He reports that he does not drink alcohol.  No Known Allergies  Family History  Problem Relation Age of Onset   Gestational diabetes Mother    Bladder Cancer Neg Hx    Kidney cancer Neg Hx    Prostate cancer Neg Hx     Prior to Admission medications   Medication Sig Start Date End Date Taking? Authorizing Provider  albuterol (VENTOLIN HFA) 108 (90 Base) MCG/ACT inhaler Inhale 2 puffs into the lungs every 6 (six) hours as needed for wheezing or shortness of breath.   Yes [provider]  gabapentin (NEURONTIN) 300 MG capsule Take 300 mg by mouth 3 (three) times daily.   Yes [provider]  ibuprofen (ADVIL) 200 MG tablet Take 600 mg by mouth every 6 (six) hours as needed (for pain).   Yes [provider]  predniSONE (DELTASONE) 10 MG tablet Take 10 mg by mouth See admin instructions. Take 10 mg by mouth three times a day for 2 days, 10 mg two times a day for 5 days, then 10 mg once a day until finished   Yes [provider]  VYVANSE 40 MG capsule Take 40 mg by mouth daily as needed (for work).   Yes [provider]  docusate sodium (COLACE) 100 MG capsule  Take 1 tablet once or twice daily as needed for constipation while taking narcotic pain medicine Patient not taking: Reported on 05/03/2021 03/12/19   Hinda Kehr, MD  ibuprofen (ADVIL,MOTRIN) 800 MG tablet Take 1 tablet (800 mg total) by mouth every 8 (eight) hours as needed. Patient not taking: Reported on 05/03/2021 02/26/16   Drenda Freeze, MD  ondansetron (ZOFRAN ODT) 4 MG disintegrating tablet Allow 1-2 tablets to dissolve in your mouth every 8 hours as needed for nausea/vomiting Patient not taking: Reported on 05/03/2021 03/12/19   Hinda Kehr, MD  oxyCODONE-acetaminophen (PERCOCET) 5-325 MG tablet Take 2 tablets by mouth every 6 (six) hours as needed for severe pain. Patient not taking: Reported on  05/03/2021 03/12/19   Hinda Kehr, MD  tamsulosin [facility]) 0.4 MG CAPS capsule Take 1 tablet by mouth daily until you pass the kidney stone or no longer have symptoms Patient not taking: Reported on 05/03/2021 03/12/19   Hinda Kehr, MD    Physical Exam: Constitutional: Moderately built and nourished. Vitals:   05/04/21 0130 05/04/21 0347 05/04/21 0457 05/04/21 0503  BP: (!) 167/94 (!) 166/86 (!) 153/81 (!) 152/88  Pulse: (!) 106 83 84 89  Resp: 18 20 20 17   Temp:      TempSrc:      SpO2: 94% 95% 95% 98%  Weight:      Height:       Eyes: Anicteric no pallor. ENMT: No discharge from the ears eyes nose and mouth. Neck: No mass felt.  No neck rigidity. Respiratory: No rhonchi or crepitations. Cardiovascular: S1-S2 heard. Abdomen: Soft nontender bowel sound present. Musculoskeletal: No edema.  No spine tenderness. Skin: No rash. Neurologic: Alert awake oriented time place and person.  Left lower extremity is 4 x 5 in strength.  Rest of extremities are 5 x 5. Psychiatric: Appears normal, normal affect.   Labs on Admission: I have personally reviewed following labs and imaging studies  CBC: Recent Labs  Lab 05/03/21 1755  WBC 15.4*  NEUTROABS 13.3*  HGB 15.9  HCT 46.5  MCV 83.9  PLT 123456   Basic Metabolic Panel: Recent Labs  Lab 05/03/21 1755  NA 140  K 4.6  CL 105  CO2 26  GLUCOSE 142*  BUN 20  CREATININE 1.10  CALCIUM 9.3   GFR: Estimated Creatinine Clearance: 155.3 mL/min (by C-G formula based on SCr of 1.1 mg/dL). Liver Function Tests: Recent Labs  Lab 05/03/21 1755  AST 19  ALT 28  ALKPHOS 74  BILITOT 0.5  PROT 7.1  ALBUMIN 4.8   No results for input(s): LIPASE, AMYLASE in the last 168 hours. No results for input(s): AMMONIA in the last 168 hours. Coagulation Profile: No results for input(s): INR, PROTIME in the last 168 hours. Cardiac Enzymes: No results for input(s): CKTOTAL, CKMB, CKMBINDEX, TROPONINI in the last 168 hours. BNP (last 3  results) No results for input(s): PROBNP in the last 8760 hours. HbA1C: No results for input(s): HGBA1C in the last 72 hours. CBG: No results for input(s): GLUCAP in the last 168 hours. Lipid Profile: No results for input(s): CHOL, HDL, LDLCALC, TRIG, CHOLHDL, LDLDIRECT in the last 72 hours. Thyroid Function Tests: No results for input(s): TSH, T4TOTAL, FREET4, T3FREE, THYROIDAB in the last 72 hours. Anemia Panel: No results for input(s): VITAMINB12, FOLATE, FERRITIN, TIBC, IRON, RETICCTPCT in the last 72 hours. Urine analysis:    Component Value Date/Time   COLORURINE YELLOW 05/03/2021 1755   APPEARANCEUR HAZY (A) 05/03/2021 1755   APPEARANCEUR  Clear 02/04/2017 1400   LABSPEC 1.023 05/03/2021 1755   PHURINE 7.0 05/03/2021 1755   GLUCOSEU NEGATIVE 05/03/2021 1755   HGBUR TRACE (A) 05/03/2021 1755   BILIRUBINUR NEGATIVE 05/03/2021 1755   BILIRUBINUR Negative 02/04/2017 1400   KETONESUR NEGATIVE 05/03/2021 1755   PROTEINUR NEGATIVE 05/03/2021 1755   NITRITE NEGATIVE 05/03/2021 1755   LEUKOCYTESUR NEGATIVE 05/03/2021 1755   Sepsis Labs: @LABRCNTIP (procalcitonin:4,lacticidven:4) ) Recent Results (from the past 240 hour(s))  Resp Panel by RT-PCR (Flu A&B, Covid)     Status: None   Collection Time: 05/03/21  5:55 PM   Specimen: Nasopharyngeal(NP) swabs in vial transport medium  Result Value Ref Range Status   SARS Coronavirus 2 by RT PCR NEGATIVE NEGATIVE Final    Comment: (NOTE) SARS-CoV-2 target nucleic acids are NOT DETECTED.  The SARS-CoV-2 RNA is generally detectable in upper respiratory specimens during the acute phase of infection. The lowest concentration of SARS-CoV-2 viral copies this assay can detect is 138 copies/mL. A negative result does not preclude SARS-Cov-2 infection and should not be used as the sole basis for treatment or other patient management decisions. A negative result may occur with  improper specimen collection/handling, submission of specimen  other than nasopharyngeal swab, presence of viral mutation(s) within the areas targeted by this assay, and inadequate number of viral copies(<138 copies/mL). A negative result must be combined with clinical observations, patient history, and epidemiological information. The expected result is Negative.  Fact Sheet for Patients:  05/05/21  Fact Sheet for Healthcare Providers:  BloggerCourse.com  This test is no t yet approved or cleared by the [employer] FDA and  has been authorized for detection and/or diagnosis of SARS-CoV-2 by FDA under an Emergency Use Authorization (EUA). This EUA will remain  in effect (meaning this test can be used) for the duration of the COVID-19 declaration under Section 564(b)(1) of the Act, 21 U.S.C.section 360bbb-3(b)(1), unless the authorization is terminated  or revoked sooner.       Influenza A by PCR NEGATIVE NEGATIVE Final   Influenza B by PCR NEGATIVE NEGATIVE Final    Comment: (NOTE) The Xpert Xpress SARS-CoV-2/FLU/RSV plus assay is intended as an aid in the diagnosis of influenza from Nasopharyngeal swab specimens and should not be used as a sole basis for treatment. Nasal washings and aspirates are unacceptable for Xpert Xpress SARS-CoV-2/FLU/RSV testing.  Fact Sheet for Patients: [facility]  Fact Sheet for Healthcare Providers: BloggerCourse.com  This test is not yet approved or cleared by the [employer] FDA and has been authorized for detection and/or diagnosis of SARS-CoV-2 by FDA under an Emergency Use Authorization (EUA). This EUA will remain in effect (meaning this test can be used) for the duration of the COVID-19 declaration under Section 564(b)(1) of the Act, 21 U.S.C. section 360bbb-3(b)(1), unless the authorization is terminated or revoked.  Performed at [facility], [address] [place], [place]      Radiological Exams on Admission: DG Orbits  Result Date: 05/03/2021 CLINICAL DATA:  Rule out foreign body for MRI. EXAM: ORBITS - COMPLETE 4+ VIEW COMPARISON:  None. FINDINGS: No radiopaque foreign object identified. IMPRESSION: No radiopaque foreign object identified. Electronically Signed   By: 05/05/2021 M.D.   On: 05/03/2021 23:09   CT THORACIC SPINE W CONTRAST  Result Date: 05/04/2021 CLINICAL DATA:  25 year old male with increasing back pain. History of L5 herniation. One episode of incontinence. EXAM: CT THORACIC AND LUMBAR SPINE WITH CONTRAST TECHNIQUE: Multidetector CT imaging of the thoracic  and lumbar spine was performed with intravenous contrast. Multiplanar CT image reconstructions were also generated. CONTRAST:  60 mL Omnipaque 300 COMPARISON:  CT Abdomen and Pelvis 03/11/2019. Chest radiographs 10/08/2020. FINDINGS: CT THORACIC SPINE FINDINGS Limited cervical spine imaging: Cervicothoracic junction alignment is within normal limits. Posterolateral C6-C7 endplate spurring likely with associated disc bulging. Thoracic spine segmentation: T12 level not completely included on the thoracic images but normal segmentation when comparing to the 2020 CT. Alignment: Chronic straightening of thoracic kyphosis. No thoracic spondylolisthesis. Vertebrae: No acute osseous abnormality identified. Bone mineralization is within normal limits. Multilevel small thoracic endplate Schmorl's nodes, most pronounced at T6 (series 9, image 54) and T11. Visible posterior ribs appear intact. Paraspinal and other soft tissues: Respiratory motion, but the visible major airways and lungs appear clear. No pericardial or pleural effusion. Negative visible mediastinum. Thoracic paraspinal soft tissues are within normal limits. No abnormal enhancement identified. 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 Evidence of superimposed disc bulging  with mild endplate spurring at 075-GRM, T7-T8, T9-T10, and T10-T11. But mild if any associated overall thoracic spinal stenosis. No CT evidence of moderate or high-grade thoracic spinal stenosis. CT LUMBAR SPINE FINDINGS Segmentation: Normal, concordant with the thoracic numbering. Alignment: Stable since 2020. Grade 1 anterolisthesis of L5 on S1 measuring 5 mm. Otherwise straightening of lumbar lordosis. Subtle levoconvex lower lumbar scoliosis. Vertebrae: Chronic L5 pars fractures present in 2020. No acute osseous abnormality identified. Intact visible sacrum and SI joints. Paraspinal and other soft tissues: Stable, negative visible abdominal and pelvic viscera. Lumbar paraspinal soft tissues are normal. No abnormal enhancement identified. 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 The following superimposed degenerative changes are noted T12-L1:  Negative. L1-L2:  Negative. L2-L3:  Negative. L3-L4: Mild motion artifact. Mild circumferential disc bulge. Up to mild overall spinal stenosis. No convincing foraminal stenosis. L4-L5: Mild circumferential disc bulge. Posterior component of disc appears broad-based. No convincing stenosis. L5-S1: Chronic pars fractures with grade 1 spondylolisthesis. Mild circumferential disc/pseudo disc bulge. Mild to moderate facet hypertrophy. No spinal or lateral recess stenosis, but moderate to severe multifactorial left L5 foraminal stenosis which appears to be chronic and probably not significantly changed from 2020. Contralateral right L5 foraminal stenosis appears mild. IMPRESSION: 1. No acute osseous abnormality. No acute or inflammatory process identified. 2. 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 Age advanced although generally mild disc degeneration intermittently in the thoracic and lumbar spine, including thoracic disc bulging and Schmorl's nodes. But mild if any subsequent thoracic and lumbar spinal stenosis. No  CT evidence of moderate or severe spinal stenosis. 3. 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 Query left L5 radiculitis. 4. Negative visible chest, abdominal, and pelvic viscera. Electronically Signed   By: Genevie Ann M.D.   On: 05/04/2021 06:08   CT LUMBAR SPINE W CONTRAST  Result Date: 05/04/2021 CLINICAL DATA:  25 year old male with increasing back pain. History of L5 herniation. One episode of incontinence. EXAM: CT THORACIC AND LUMBAR SPINE WITH CONTRAST TECHNIQUE: Multidetector CT imaging of the thoracic and lumbar spine was performed with intravenous contrast. Multiplanar CT image reconstructions were also generated. CONTRAST:  60 mL Omnipaque 300 COMPARISON:  CT Abdomen and Pelvis 03/11/2019. Chest radiographs 10/08/2020. FINDINGS: CT THORACIC SPINE FINDINGS Limited cervical spine imaging: Cervicothoracic junction alignment is within normal  limits. Posterolateral C6-C7 endplate spurring likely with associated disc bulging. Thoracic spine segmentation: T12 level not completely included on the thoracic images but normal segmentation when comparing to the 2020 CT. Alignment: Chronic straightening of thoracic kyphosis. No thoracic spondylolisthesis. Vertebrae: No acute osseous abnormality identified. Bone mineralization is within normal limits. Multilevel small thoracic endplate Schmorl's nodes, most pronounced at T6 (series 9, image 54) and T11. Visible posterior ribs appear intact. Paraspinal and other soft tissues: Respiratory motion, but the visible major airways and lungs appear clear. No pericardial or pleural effusion. Negative visible mediastinum. Thoracic paraspinal soft tissues are within normal limits. No abnormal enhancement identified. Disc levels: Somewhat congenital short pedicles throughout the thoracic  spine. Evidence of superimposed disc bulging with mild endplate spurring at 075-GRM, T7-T8, T9-T10, and T10-T11. But mild if any associated overall thoracic spinal stenosis. No CT evidence of moderate or high-grade thoracic spinal stenosis. CT LUMBAR SPINE FINDINGS Segmentation: Normal, concordant with the thoracic numbering. Alignment: Stable since 2020. Grade 1 anterolisthesis of L5 on S1 measuring 5 mm. Otherwise straightening of lumbar lordosis. Subtle levoconvex lower lumbar scoliosis. Vertebrae: Chronic L5 pars fractures present in 2020. No acute osseous abnormality identified. Intact visible sacrum and SI joints. Paraspinal and other soft tissues: Stable, negative visible abdominal and pelvic viscera. Lumbar paraspinal soft tissues are normal. No abnormal enhancement identified. 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 The following superimposed degenerative changes are noted T12-L1:  Negative. L1-L2:  Negative. L2-L3:  Negative. L3-L4: Mild motion artifact. Mild circumferential disc bulge. Up to mild overall spinal stenosis. No convincing foraminal stenosis. L4-L5: Mild circumferential disc bulge. Posterior component of disc appears broad-based. No convincing stenosis. L5-S1: Chronic pars fractures with grade 1 spondylolisthesis. Mild circumferential disc/pseudo disc bulge. Mild to moderate facet hypertrophy. No spinal or lateral recess stenosis, but moderate to severe multifactorial left L5 foraminal stenosis which appears to be chronic and probably not significantly changed from 2020. Contralateral right L5 foraminal stenosis appears mild. IMPRESSION: 1. No acute osseous abnormality. No acute or inflammatory process identified. 2. 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 Age advanced although generally mild disc degeneration intermittently in the thoracic and lumbar spine, including thoracic disc bulging and Schmorl's nodes. But mild if any  subsequent thoracic and lumbar spinal stenosis. No CT evidence of moderate or severe spinal stenosis. 3. 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 Query left L5 radiculitis. 4. Negative visible chest, abdominal, and pelvic viscera. Electronically Signed   By: Genevie Ann M.D.   On: 05/04/2021 06:08      Assessment/Plan Principal Problem:   Low back pain    Back pain with pain radiating to left lower extremity with mild weakness of the left lower extremity with prior history of L5 disc herniation had received recent epidural injection despite which patient's pain has been worsening and also has new weakness.  Patient also had incontinent of urine.  Plan is to get MRI under anesthesia since patient is not able to lie flat because of the pain.  ER physician has already consulted anesthesia.  Patient remain n.p.o. until the MRI is done and anesthesia.  Further plans will be based on the results of the MRI.  Patient usually follows with Dr. Gladstone Lighter with [facility].  We will keep patient on pain medication and muscle relaxants until  then. History of ADHD takes Vyvanse as needed. Leukocytosis could be from recent use of steroids.  No definite signs of infection follow MRI. Elevated blood pressure reading could be from discomfort.  Follow blood pressure trends.  We will keep patient p.o. and IV hydralazine.   DVT prophylaxis: SCDs.  Avoiding anticoagulation until we get MRI results. Code Status: Full code. Family Communication: Patient's mother and dad. Disposition Plan: Home. Consults called: None. Admission status: Observation.   Rise Patience MD [facility] Pager [phone].  If 7PM-7AM, please contact night-coverage [email or web address] Password [facility]  05/04/2021, 6:29 AM

## 2021-05-04 NOTE — Hospital Course (Addendum)
Patient is a 25 year old male with past medical history of ADHD, kidney stones and obesity with L5 disc herniation after a fall in April 2022 followed by orthopedics as outpatient, who had new severe back pain x1 week) and had a recent epidural injection by orthopedics on 12/22), who presented to the emergency room on afternoon of 12/25 for mild weakness of left lower extremity and bowel/bladder incontinence.  CT findings unremarkable.  Despite multiple doses of Dilaudid and ketamine, patient still having pain and unable to lay still for MRI.  MRI able to be done on afternoon of 12/26 under general anesthesia (patient requiring intubation) after waiting 6 hours following food intake.  Patient noted on MRI to have moderate to severe left and mild right foraminal stenosis at the L5/S1 level although no canal stenosis noted.  Orthopedic surgery consulted.

## 2021-05-04 NOTE — Assessment & Plan Note (Signed)
On as needed Vyvanse

## 2021-05-04 NOTE — ED Notes (Signed)
Pt remains in MRI at this time  

## 2021-05-04 NOTE — Transfer of Care (Signed)
Immediate Anesthesia Transfer of Care Note  Patient: Gregory Fry  Procedure(s) Performed: MRI WITH ANESTHESIA  Patient Location: PACU  Anesthesia Type:General  Level of Consciousness: drowsy  Airway & Oxygen Therapy: Patient Spontanous Breathing  Post-op Assessment: Report given to RN and Post -op Vital signs reviewed and stable  Post vital signs: Reviewed and stable  Last Vitals:  Vitals Value Taken Time  BP 155/95 05/04/21 1358  Temp    Pulse 113 05/04/21 1400  Resp 16 05/04/21 1400  SpO2 95 % 05/04/21 1400  Vitals shown include unvalidated device data.  Last Pain:  Vitals:   05/04/21 0952  TempSrc:   PainSc: 7       Patients Stated Pain Goal: 2 (05/04/21 7867)  Complications: No notable events documented.

## 2021-05-04 NOTE — Assessment & Plan Note (Signed)
Patient states that normally his bowels move on a daily basis.  Since he was started on pain medication prior to coming in, has had issues with constipation.  We will go ahead and start him on a regimen of daily MiraLAX and Colace.  If he does not have a bowel movement every 24 hours will use something stronger as well.

## 2021-05-04 NOTE — Progress Notes (Signed)
[facility] Progress Note  Patient: Gregory Fry    GUY:[id number]  DOA: 05/03/2021    Date of Service: the patient was seen and examined on 05/04/2021  Brief hospital course: Patient is a 25 year old male with past medical history of ADHD, kidney stones and obesity with L5 disc herniation after a fall in April 2022 followed by orthopedics as outpatient, who had new severe back pain x1 week) and had a recent epidural injection by orthopedics on 12/22), who presented to the emergency room on afternoon of 12/25 for mild weakness of left lower extremity and bowel/bladder incontinence.  CT findings unremarkable.  Despite multiple doses of Dilaudid and ketamine, patient still having pain and unable to lay still for MRI.  MRI able to be done on afternoon of 12/26 under general anesthesia (patient requiring intubation) after waiting 6 hours following food intake.  Patient noted on MRI to have moderate to severe left and mild right foraminal stenosis at the L5/S1 level although no canal stenosis noted.  Orthopedic surgery consulted.  Assessment and Plan: Digestive Constipation due to opioid therapy Assessment & Plan Patient states that normally his bowels move on a daily basis.  Since he was started on pain medication prior to coming in, has had issues with constipation.  We will go ahead and start him on a regimen of daily MiraLAX and Colace.  If he does not have a bowel movement every 24 hours will use something stronger as well.  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  Orthopedics consulted.  Since his Orthopedist does not do surgery, on-call Orthopedist does not do spinal procedures, so case referred to neurosurgery.  Neurosurgery  reviewed MRI's and no acute findings.  Recommendation is for pain control and steroids and they will see him in follow-up.  Will start with IV steroids and for pain control, will try combination of toradol, IV Dilaudid, lidocaine and fentanyl patch.  Other Obesity (BMI 30-39.9) Assessment & Plan Meets criteria BMI greater than 30.  ADHD Assessment & Plan On as needed Vyvanse  Elevated blood pressure reading Assessment & Plan Blood pressure is elevated, ranging from 150s to 160s systolic, suspect secondary to pain.  In speaking to patient, his blood pressure normally runs in the 140s, which is high for his age.  There is a family history.  Have started low-dose p.o. hydralazine.  Incontinence Assessment & Plan Clarified history with patient.  Patient did not experience true bowel/bladder incontinence, but rather felt urge, but due to severe pain, this limited his ability to get to the bathroom in time which caused his problems.    Body mass index is 39.37 kg/m.        Consultants: Case discussed with orthopedics Case discussed with neurosurgery Anesthesia  Procedures: General anesthesia including intubation prior to MRI  Antimicrobials: None  Code Status: Full code   Subjective: Back pain better, about a 4/10 currently  Objective: Elevated blood pressure ranging from 140s to 160s, otherwise rest of vitals okay Vitals:   05/04/21 1445 05/04/21 1459  BP: (!) 161/96 (!) 148/83  Pulse: 83 90  Resp: 13 17  Temp:  98.4 F (36.9 C)  SpO2: 100% 100%    Intake/Output Summary (Last 24 hours) at 05/04/2021 1748 Last data filed at  05/04/2021 1700 Gross per 24 hour  Intake 1101.01 ml  Output 650 ml  Net 451.01 ml   Filed Weights   05/03/21 1559 05/04/21 0909  Weight: (!) 142.9 kg (!) 142.9 kg   Body mass index is 39.37 kg/m.  Exam:  General: Alert and oriented x3, no acute distress HEENT: Normocephalic, atraumatic, mucous membranes are moist Cardiovascular:  Regular rate and rhythm, S1-S2 Respiratory: Clear to auscultation bilaterally Abdomen: Soft, nontender, nondistended, positive bowel sounds Musculoskeletal: No clubbing or cyanosis or edema Skin: No skin breaks, tears or lesions Psychiatry: Appropriate, no evidence of psychoses Neurology: No focal deficits in terms of ability to move upper and lower extremities  Data Reviewed: My review of labs, imaging, notes and other tests shows no new significant findings.   Disposition:  Status is: Inpatient  Patient is requiring IV medication for pain and inflammatory control   Family Communication: Parents at the bedside DVT Prophylaxis: SCDs Start: 05/04/21 6144    Author: Hollice Espy ,MD 05/04/2021 5:48 PM  To reach On-call, see care teams to locate the attending and reach out via [email or web address]. Between 7PM-7AM, please contact night-coverage If you still have difficulty reaching the attending provider, please page the [facility] (Director on Call) for [facility] on amion for assistance.

## 2021-05-04 NOTE — Assessment & Plan Note (Addendum)
Blood pressure is elevated, ranging from 150s to 160s systolic, suspect secondary to pain.  In speaking to patient, his blood pressure normally runs in the 140s, which is high for his age.  There is a family history.  Have started low-dose p.o. hydralazine.

## 2021-05-05 ENCOUNTER — Encounter (HOSPITAL_COMMUNITY): Payer: Self-pay | Admitting: Interventional Radiology

## 2021-05-05 ENCOUNTER — Telehealth: Payer: Self-pay | Admitting: Family Medicine

## 2021-05-05 DIAGNOSIS — R29898 Other symptoms and signs involving the musculoskeletal system: Secondary | ICD-10-CM

## 2021-05-05 DIAGNOSIS — M48061 Spinal stenosis, lumbar region without neurogenic claudication: Secondary | ICD-10-CM | POA: Diagnosis not present

## 2021-05-05 DIAGNOSIS — K5903 Drug induced constipation: Secondary | ICD-10-CM | POA: Diagnosis not present

## 2021-05-05 DIAGNOSIS — M5442 Lumbago with sciatica, left side: Secondary | ICD-10-CM | POA: Diagnosis not present

## 2021-05-05 DIAGNOSIS — R030 Elevated blood-pressure reading, without diagnosis of hypertension: Secondary | ICD-10-CM | POA: Diagnosis not present

## 2021-05-05 LAB — HIV ANTIBODY (ROUTINE TESTING W REFLEX): HIV Screen 4th Generation wRfx: NONREACTIVE

## 2021-05-05 MED ORDER — METHYLPREDNISOLONE 4 MG PO TBPK
4.0000 mg | ORAL_TABLET | ORAL | Status: AC
  Administered 2021-05-05: 17:00:00 4 mg via ORAL

## 2021-05-05 MED ORDER — METHYLPREDNISOLONE 4 MG PO TBPK
8.0000 mg | ORAL_TABLET | Freq: Every morning | ORAL | Status: AC
  Administered 2021-05-05: 12:00:00 8 mg via ORAL
  Filled 2021-05-05: qty 21

## 2021-05-05 MED ORDER — HYDROMORPHONE HCL 2 MG PO TABS
1.0000 mg | ORAL_TABLET | Freq: Four times a day (QID) | ORAL | 0 refills | Status: AC | PRN
Start: 2021-05-05 — End: 2021-05-10

## 2021-05-05 MED ORDER — METHYLPREDNISOLONE 4 MG PO TBPK: 8.0000 mg | ORAL_TABLET | Freq: Every evening | ORAL | Status: DC

## 2021-05-05 MED ORDER — METHYLPREDNISOLONE 4 MG PO TBPK: 4.0000 mg | ORAL_TABLET | Freq: Four times a day (QID) | ORAL | Status: DC

## 2021-05-05 MED ORDER — POLYETHYLENE GLYCOL 3350 17 G PO PACK: 17.0000 g | PACK | Freq: Every day | ORAL | 0 refills | Status: DC

## 2021-05-05 MED ORDER — METHYLPREDNISOLONE 4 MG PO TBPK
4.0000 mg | ORAL_TABLET | ORAL | Status: AC
  Administered 2021-05-05: 14:00:00 4 mg via ORAL

## 2021-05-05 MED ORDER — METHYLPREDNISOLONE 4 MG PO TBPK: 4.0000 mg | ORAL_TABLET | Freq: Three times a day (TID) | ORAL | Status: DC

## 2021-05-05 MED ORDER — METHYLPREDNISOLONE 4 MG PO TBPK: ORAL_TABLET | ORAL | 0 refills | Status: DC

## 2021-05-05 MED ORDER — FENTANYL 12 MCG/HR TD PT72: 1.0000 | MEDICATED_PATCH | TRANSDERMAL | 0 refills | Status: DC

## 2021-05-05 MED ORDER — HYDROMORPHONE HCL 1 MG/ML IJ SOLN: 0.5000 mg | INTRAMUSCULAR | Status: DC | PRN

## 2021-05-05 MED ORDER — LIDOCAINE 5 % EX PTCH: 1.0000 | MEDICATED_PATCH | CUTANEOUS | 0 refills | Status: DC

## 2021-05-05 NOTE — Discharge Summary (Signed)
Physician Discharge Summary   Patient: Gregory Fry MRN: [id number] DOB: @DOB   Admit date:     05/03/2021  Discharge date: 05/05/21  Discharge Physician: Annita Brod   PCP: Ferd Hibbs, NP   Recommendations at discharge: 1.  Patient will follow-up with [facility] 2. new medication: Lidocaine patch 5%.  Apply to lower back, on for 12 hours during day, off for 12 hours at night. 3. new medication: Medrol Dosepak 4. new medication: MiraLAX 17 g p.o. daily 5. new medication: Dilaudid 2 mg 1/2-1/4 tab p.o. every 6 hours as needed for breakthrough pain only.  Total number 5 pills. 6. new medication: Fentanyl patch 12 mg.  After current patch expires, Place 1 patch x3 days.  For the following patch, cut in half for 3 days, then other half for 3 days.  For the following patch after that, cut into fourths and use a one fourth patch and then stop. 7. patient will follow-up with his PCP to be evaluated for started on blood pressure medication.   Hospital Course   Patient is a 25 year old male with past medical history of ADHD, kidney stones and obesity with L5 disc herniation after a fall in April 2022 followed by orthopedics as outpatient, who had new severe back pain x1 week) and had a recent epidural injection by orthopedics on 12/22), who presented to the emergency room on afternoon of 12/25 for mild weakness of left lower extremity and bowel/bladder incontinence.  CT findings unremarkable.  Despite multiple doses of Dilaudid and ketamine, patient still having pain and unable to lay still for MRI.  MRI able to be done on afternoon of 12/26 under general anesthesia (patient requiring intubation) after waiting 6 hours following food intake.  Patient noted on MRI to have moderate to severe left and mild right foraminal stenosis at the L5/S1 level although no canal stenosis noted.  Orthopedic surgery consulted, however orthopedic specialist on-call for patient's orthopedist not  spine specialist and deferred case to neurosurgery.  Case discussed with neurosurgery who reviewed films and recommended pain control and steroids.  They will see patient in outpatient setting.  Patient treated with low-dose 12 mcg Duragesic patch, IV steroids and Toradol.  By following morning, not much improvement.  Given additional dose of Toradol.  Steroids changed to p.o. and lidocaine patch added.  Patient required 1 dose of breakthrough IV Dilaudid during hospital stay.  Overall feeling much better, ambulating well and able to discharge home.  Discharge Diagnoses Digestive Constipation due to opioid therapy Assessment & Plan Patient states that normally his bowels move on a daily basis.  Since he was started on pain medication prior to coming in, has had issues with constipation.  We will go ahead and start him on a regimen of daily MiraLAX and Colace.  Responded well to this and will continue Colace at home plus as needed MiraLAX.  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  Orthopedics consulted.  Since his Orthopedist does not do surgery, on-call Orthopedist does not do spinal procedures, so case referred to neurosurgery.  Neurosurgery reviewed MRI's and no acute findings.  Recommendation is for pain control and steroids and they will see him in follow-up.  As noted above.  Patient be discharged on a Medrol  Dosepak as well as lidocaine patch, on for 12 hours and off for 12 hours.  He will be discharged on a fentanyl patch as well with instructions to continue his current patch for 2 more days then replace.  When this patch expires, he will be finishing his Medrol Dosepak.  At that point, he is instructed to cut fentanyl patch in  half and use half and then taper off altogether.  Mother is present who is familiar with pain medications as she is works in palliative care.  Both are amenable to this plan.  Given 5 pills of Dilaudid, 1/2-1/4 tabs for breakthrough pain, although I doubt he will need this.  Other Obesity (BMI 30-39.9) Assessment & Plan Meets criteria BMI greater than 30.  ADHD Assessment & Plan On as needed Vyvanse  Elevated blood pressure reading Assessment & Plan Blood pressure is elevated, ranging from [id number] to [id number] systolic, suspect secondary to pain.  In speaking to patient, his blood pressure normally runs in the 140s, which is high for his age.  There is a family history.  Was put on hydralazine during hospitalization.  Once his pain got under control and he was resting comfortably in combination with pain meds, blood pressure is actually slightly soft.  Was hesitant to start him on blood pressure medication while he is feeling better on pain medication for concerns of hypotension.  Patient states though that his blood pressure normally runs in the 140s which is high especially for his age and given his weight, would recommend that he follow-up with his PCP for future consideration of blood pressure medication.  Incontinence Assessment & Plan Clarified history with patient.  Patient did not experience true bowel/bladder incontinence, but rather felt urge, but due to severe pain, this limited his ability to get to the bathroom in time which caused his problems.         Consultants: Case discussed with orthopedics Case discussed with neurosurgery  Procedures performed: Intubation for general anesthesia prior to MRI Disposition: Home Diet recommendation: Regular diet  DISCHARGE MEDICATION: Allergies as of 05/05/2021   No Known Allergies      Medication List     STOP taking these medications    ibuprofen 200 MG tablet Commonly known as: ADVIL   ibuprofen 800 MG tablet Commonly known  as: ADVIL   ondansetron 4 MG disintegrating tablet Commonly known as: Zofran ODT   oxyCODONE-acetaminophen 5-325 MG tablet Commonly known as: Percocet   predniSONE 10 MG tablet Commonly known as: DELTASONE   tamsulosin 0.4 MG Caps capsule Commonly known as: FLOMAX       TAKE these medications    albuterol 108 (90 Base) MCG/ACT inhaler Commonly known as: VENTOLIN HFA Inhale 2 puffs into the lungs every 6 (six) hours as needed for wheezing or shortness of breath.   docusate sodium 100 MG capsule Commonly known as: Colace Take 1 tablet once or twice daily as needed for constipation while taking narcotic pain medicine   fentaNYL 12 MCG/HR Commonly known as: [place] 1 patch onto the skin every 3 (three) days. With 3rd patch, cut in half and use only half for 3 days.  Then use other half.  Then for 4th patch, cut into 1/4ths and use only 1.  Then stop. Start taking on: May 07, 2021   gabapentin 300 MG capsule Commonly known as: NEURONTIN Take 300 mg by mouth 3 (three) times daily.   HYDROmorphone 2 MG tablet Commonly known as: Dilaudid Take 0.5 tablets (  1 mg total) by mouth every 6 (six) hours as needed for up to 5 days (breakthrough pain).   lidocaine 5 % Commonly known as: LIDODERM Place 1 patch onto the skin daily. On for 12 hours during day, off for 12 hours and evening/night Start taking on: May 06, 2021   methylPREDNISolone 4 MG Tbpk tablet Commonly known as: MEDROL DOSEPAK Day 1: 4 mg after dinner, 8 mg hs, Day 2: 4 mg tid after meals, 8 mg hs, Day 3: 4 mg TID after meals and HS, Day 4: 4 mg after breakfast and lunch and HS, Day 5: 4 mg after breakfast and at night, Day 6: 4 mg after breakfast   polyethylene glycol 17 g packet Commonly known as: MIRALAX / GLYCOLAX Take 17 g by mouth daily. Start taking on: May 06, 2021   Vyvanse 40 MG capsule Generic drug: lisdexamfetamine Take 40 mg by mouth daily as needed (for work).         Follow-up Information     [facility]. Schedule an appointment as soon as possible for a visit.   Specialty: Neurosurgery Contact information: [address] [place] [address] [phone]                 Discharge Exam: Danley Danker Weights   May 20, 2021 1559 05/04/21 0909  Weight: (!) 142.9 kg (!) 142.9 kg   General: Alert and oriented x3, no acute distress Cardiovascular: Regular rate and rhythm, S1-S2 Lungs: Clear to auscultation bilaterally  Condition at discharge: good  The results of significant diagnostics from this hospitalization (including imaging, microbiology, ancillary and laboratory) are listed below for reference.   Imaging Studies: DG Orbits  Result Date: 05/20/21 CLINICAL DATA:  Rule out foreign body for MRI. EXAM: ORBITS - COMPLETE 4+ VIEW COMPARISON:  None. FINDINGS: No radiopaque foreign object identified. IMPRESSION: No radiopaque foreign object identified. Electronically Signed   By: Anner Crete M.D.   On: 2021/05/20 23:09   CT THORACIC SPINE W CONTRAST  Result Date: 05/04/2021 CLINICAL DATA:  25 year old male with increasing back pain. History of L5 herniation. One episode of incontinence. EXAM: CT THORACIC AND LUMBAR SPINE WITH CONTRAST TECHNIQUE: Multidetector CT imaging of the thoracic and lumbar spine was performed with intravenous contrast. Multiplanar CT image reconstructions were also generated. CONTRAST:  60 mL Omnipaque 300 COMPARISON:  CT Abdomen and Pelvis 03/11/2019. Chest radiographs 10/08/2020. FINDINGS: CT THORACIC SPINE FINDINGS Limited cervical spine imaging: Cervicothoracic junction alignment is within normal limits. Posterolateral C6-C7 endplate spurring likely with associated disc bulging. Thoracic spine segmentation: T12 level not completely included on the thoracic images but normal segmentation when comparing to the 2020 CT. Alignment: Chronic straightening of thoracic kyphosis. No  thoracic spondylolisthesis. Vertebrae: No acute osseous abnormality identified. Bone mineralization is within normal limits. Multilevel small thoracic endplate Schmorl's nodes, most pronounced at T6 (series 9, image 54) and T11. Visible posterior ribs appear intact. Paraspinal and other soft tissues: Respiratory motion, but the visible major airways and lungs appear clear. No pericardial or pleural effusion. Negative visible mediastinum. Thoracic paraspinal soft tissues are within normal limits. No abnormal enhancement identified. 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 Evidence of superimposed disc bulging with mild endplate spurring at 075-GRM, T7-T8, T9-T10, and T10-T11. But mild if any associated overall thoracic spinal stenosis. No CT evidence of moderate or high-grade thoracic spinal stenosis. CT LUMBAR SPINE FINDINGS Segmentation: Normal, concordant with the thoracic numbering. Alignment: Stable since 2020. Grade  1 anterolisthesis of L5 on S1 measuring 5 mm. Otherwise straightening of lumbar lordosis. Subtle levoconvex lower lumbar scoliosis. Vertebrae: Chronic L5 pars fractures present in 2020. No acute osseous abnormality identified. Intact visible sacrum and SI joints. Paraspinal and other soft tissues: Stable, negative visible abdominal and pelvic viscera. Lumbar paraspinal soft tissues are normal. No abnormal enhancement identified. 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 The following superimposed degenerative changes are noted T12-L1:  Negative. L1-L2:  Negative. L2-L3:  Negative. L3-L4: Mild motion artifact. Mild circumferential disc bulge. Up to mild overall spinal stenosis. No convincing foraminal stenosis. L4-L5: Mild circumferential disc bulge. Posterior component of disc appears broad-based. No convincing stenosis. L5-S1: Chronic pars fractures with grade 1 spondylolisthesis. Mild circumferential disc/pseudo disc bulge. Mild to  moderate facet hypertrophy. No spinal or lateral recess stenosis, but moderate to severe multifactorial left L5 foraminal stenosis which appears to be chronic and probably not significantly changed from 2020. Contralateral right L5 foraminal stenosis appears mild. IMPRESSION: 1. No acute osseous abnormality. No acute or inflammatory process identified. 2. 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 Age advanced although generally mild disc degeneration intermittently in the thoracic and lumbar spine, including thoracic disc bulging and Schmorl's nodes. But mild if any subsequent thoracic and lumbar spinal stenosis. No CT evidence of moderate or severe spinal stenosis. 3. 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 Query left L5 radiculitis. 4. Negative visible chest, abdominal, and pelvic viscera. Electronically Signed   By: Genevie Ann M.D.   On: 05/04/2021 06:08   CT LUMBAR SPINE W CONTRAST  Result Date: 05/04/2021 CLINICAL DATA:  25 year old male with increasing back pain. History of L5 herniation. One episode of incontinence. EXAM: CT THORACIC AND LUMBAR SPINE WITH CONTRAST TECHNIQUE: Multidetector CT imaging of the thoracic and lumbar spine was performed with intravenous contrast. Multiplanar CT image reconstructions were also generated. CONTRAST:  60 mL Omnipaque 300 COMPARISON:  CT Abdomen and Pelvis 03/11/2019. Chest radiographs 10/08/2020. FINDINGS: CT THORACIC SPINE FINDINGS Limited cervical spine imaging: Cervicothoracic junction alignment is within normal limits. Posterolateral C6-C7 endplate spurring likely with associated disc bulging. Thoracic spine segmentation: T12 level not completely included on the thoracic images but normal segmentation when comparing to the 2020 CT. Alignment:  Chronic straightening of thoracic kyphosis. No thoracic spondylolisthesis. Vertebrae: No acute osseous abnormality identified. Bone mineralization is within normal limits. Multilevel small thoracic endplate Schmorl's nodes, most pronounced at T6 (series 9, image 54) and T11. Visible posterior ribs appear intact. Paraspinal and other soft tissues: Respiratory motion, but the visible major airways and lungs appear clear. No pericardial or pleural effusion. Negative visible mediastinum. Thoracic paraspinal soft tissues are within normal limits. No abnormal enhancement identified. 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 Evidence of superimposed disc bulging with mild endplate spurring at 075-GRM, T7-T8, T9-T10, and T10-T11. But mild if any associated overall thoracic spinal stenosis. No CT evidence of moderate or high-grade thoracic spinal stenosis. CT LUMBAR SPINE FINDINGS Segmentation: Normal, concordant with the thoracic numbering. Alignment: Stable since 2020. Grade 1 anterolisthesis of L5 on S1 measuring 5 mm. Otherwise straightening of lumbar lordosis. Subtle levoconvex lower lumbar scoliosis. Vertebrae: Chronic L5 pars fractures present in 2020. No acute osseous abnormality identified. Intact visible sacrum and SI joints. Paraspinal and other soft tissues: Stable, negative visible  abdominal and pelvic viscera. Lumbar paraspinal soft tissues are normal. No abnormal enhancement identified. 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 The following superimposed degenerative changes are noted T12-L1:  Negative. L1-L2:  Negative. L2-L3:  Negative. L3-L4: Mild motion artifact. Mild circumferential disc bulge. Up to mild overall spinal stenosis. No convincing foraminal stenosis. L4-L5: Mild circumferential disc bulge. Posterior component of disc appears broad-based. No convincing stenosis. L5-S1: Chronic pars fractures with grade 1 spondylolisthesis. Mild  circumferential disc/pseudo disc bulge. Mild to moderate facet hypertrophy. No spinal or lateral recess stenosis, but moderate to severe multifactorial left L5 foraminal stenosis which appears to be chronic and probably not significantly changed from 2020. Contralateral right L5 foraminal stenosis appears mild. IMPRESSION: 1. No acute osseous abnormality. No acute or inflammatory process identified. 2. 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 Age advanced although generally mild disc degeneration intermittently in the thoracic and lumbar spine, including thoracic disc bulging and Schmorl's nodes. But mild if any subsequent thoracic and lumbar spinal stenosis. No CT evidence of moderate or severe spinal stenosis. 3. 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 Query left L5 radiculitis. 4. Negative visible chest, abdominal, and pelvic viscera. Electronically Signed   By: Odessa Fleming M.D.   On: 05/04/2021 06:08   MR THORACIC SPINE W WO CONTRAST  Result Date: 05/04/2021 CLINICAL DATA:  Back pain after fall in April 2022. Recent spinal epidural injection. Left lower extremity weakness. Bladder and bowel incontinence. EXAM: MRI THORACIC AND LUMBAR SPINE WITHOUT AND WITH CONTRAST TECHNIQUE: Multiplanar and multiecho pulse sequences of the thoracic and lumbar spine were obtained without and with intravenous contrast. CONTRAST:  43mL GADAVIST GADOBUTROL 1 MMOL/ML IV SOLN COMPARISON:  Same day CT FINDINGS: MRI THORACIC SPINE FINDINGS Alignment: Mild straightening of the thoracic kyphosis. No static listhesis. Vertebrae: No fracture, evidence of discitis, or bone lesion. Mild diffuse intrinsic canal narrowing on the basis of congenitally short pedicles. Cord:  Normal signal and morphology. Paraspinal and  other soft tissues: Negative. Disc levels: Shallow central disc protrusion at T4-5. Remaining intervertebral discs of the thoracic spine are within normal limits. No foraminal or canal stenosis at any level. MRI LUMBAR SPINE FINDINGS Segmentation:  Standard. Alignment:  5 mm grade 1 anterolisthesis L5 on S1. Vertebrae: Chronic bilateral L5 pars interarticularis defects without associated marrow edema. No acute fracture. No evidence of discitis. No suspicious bone lesion. Conus medullaris: Extends to the T12-L1 level and appears normal. Paraspinal and other soft tissues: Negative. Disc levels: T12-L1 through L4-L5: Intervertebral discs are within normal limits. No disc desiccation or focal disc protrusion. No foraminal or canal stenosis. L5-S1: Anterolisthesis with disc uncovering with left foraminal/extraforaminal protrusion. Findings result in moderate-severe left and mild right foraminal stenosis. No canal stenosis. IMPRESSION: 1. No acute findings within the thoracic or lumbar spine. 2. 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 Resultant moderate-severe left and mild right foraminal stenosis at this level. 3. No canal stenosis at any level within the thoracic or lumbar spine. Electronically Signed   By: Duanne Guess D.O.   On: 05/04/2021 14:28   MR Lumbar Spine W Wo Contrast  Result Date: 05/04/2021 CLINICAL DATA:  Back pain after fall in April 2022. Recent spinal epidural injection. Left lower extremity weakness. Bladder and bowel incontinence. EXAM:  MRI THORACIC AND LUMBAR SPINE WITHOUT AND WITH CONTRAST TECHNIQUE: Multiplanar and multiecho pulse sequences of the thoracic and lumbar spine were obtained without and with intravenous contrast. CONTRAST:  65mL GADAVIST GADOBUTROL 1 MMOL/ML IV SOLN COMPARISON:  Same day CT FINDINGS: MRI THORACIC SPINE FINDINGS Alignment: Mild straightening of the thoracic kyphosis. No static listhesis. Vertebrae: No fracture,  evidence of discitis, or bone lesion. Mild diffuse intrinsic canal narrowing on the basis of congenitally short pedicles. Cord:  Normal signal and morphology. Paraspinal and other soft tissues: Negative. Disc levels: Shallow central disc protrusion at T4-5. Remaining intervertebral discs of the thoracic spine are within normal limits. No foraminal or canal stenosis at any level. MRI LUMBAR SPINE FINDINGS Segmentation:  Standard. Alignment:  5 mm grade 1 anterolisthesis L5 on S1. Vertebrae: Chronic bilateral L5 pars interarticularis defects without associated marrow edema. No acute fracture. No evidence of discitis. No suspicious bone lesion. Conus medullaris: Extends to the T12-L1 level and appears normal. Paraspinal and other soft tissues: Negative. Disc levels: T12-L1 through L4-L5: Intervertebral discs are within normal limits. No disc desiccation or focal disc protrusion. No foraminal or canal stenosis. L5-S1: Anterolisthesis with disc uncovering with left foraminal/extraforaminal protrusion. Findings result in moderate-severe left and mild right foraminal stenosis. No canal stenosis. IMPRESSION: 1. No acute findings within the thoracic or lumbar spine. 2. 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 Resultant moderate-severe left and mild right foraminal stenosis at this level. 3. No canal stenosis at any level within the thoracic or lumbar spine. Electronically Signed   By: Davina Poke D.O.   On: 05/04/2021 14:28    Microbiology: Results for orders placed or performed during the hospital encounter of 05/03/21  Blood culture (routine x 2)     Status: None (Preliminary result)   Collection Time: 05/03/21  5:26 PM   Specimen: BLOOD  Result Value Ref Range Status   Specimen Description   Final    BLOOD LEFT ANTECUBITAL Performed at [facility], [address], [place], [place] [address]    Special Requests   Final    BOTTLES DRAWN  AEROBIC AND ANAEROBIC Blood Culture adequate volume Performed at [facility], [address], [place], [place] [address]    Culture   Final    NO GROWTH 2 DAYS Performed at [facility], [place] [address], [place], [place] [address]    Report Status PENDING  Incomplete  Urine Culture     Status: None   Collection Time: 05/03/21  5:55 PM   Specimen: Urine, Clean Catch  Result Value Ref Range Status   Specimen Description   Final    URINE, CLEAN CATCH Performed at [facility], [address], [place], [place] [address]    Special Requests   Final    NONE Performed at [facility], [address] [place], [place] [address]    Culture   Final    NO GROWTH Performed at [facility], [place] [address] [place], [place] [address]    Report Status 05/04/2021 FINAL  Final  Blood culture (routine x 2)     Status: None (Preliminary result)   Collection Time: 05/03/21  5:55 PM   Specimen: BLOOD RIGHT HAND  Result Value Ref Range Status   Specimen Description   Final    BLOOD RIGHT HAND Performed at [facility], [address] [place],  [address]    Special Requests  Final    BOTTLES DRAWN AEROBIC AND ANAEROBIC Blood Culture adequate volume Performed at [facility], [address] [place], [place] [address]    Culture   Final    NO GROWTH 2 DAYS Performed at [facility], [place] [address] [place], [place] [address]    Report Status PENDING  Incomplete  Resp Panel by RT-PCR (Flu A&B, Covid)     Status: None   Collection Time: 05/03/21  5:55 PM   Specimen: Nasopharyngeal(NP) swabs in vial transport medium  Result Value Ref Range Status   SARS Coronavirus 2 by RT PCR NEGATIVE NEGATIVE Final    Comment: (NOTE) SARS-CoV-2 target nucleic acids are NOT DETECTED.  The SARS-CoV-2 RNA is generally detectable in upper respiratory specimens during the acute phase of  infection. The lowest concentration of SARS-CoV-2 viral copies this assay can detect is 138 copies/mL. A negative result does not preclude SARS-Cov-2 infection and should not be used as the sole basis for treatment or other patient management decisions. A negative result may occur with  improper specimen collection/handling, submission of specimen other than nasopharyngeal swab, presence of viral mutation(s) within the areas targeted by this assay, and inadequate number of viral copies(<138 copies/mL). A negative result must be combined with clinical observations, patient history, and epidemiological information. The expected result is Negative.  Fact Sheet for Patients:  EntrepreneurPulse.com.au  Fact Sheet for Healthcare Providers:  IncredibleEmployment.be  This test is no t yet approved or cleared by the [place] FDA and  has been authorized for detection and/or diagnosis of SARS-CoV-2 by FDA under an Emergency Use Authorization (EUA). This EUA will remain  in effect (meaning this test can be used) for the duration of the COVID-19 declaration under Section 564(b)(1) of the Act, 21 U.S.C.section 360bbb-3(b)(1), unless the authorization is terminated  or revoked sooner.       Influenza A by PCR NEGATIVE NEGATIVE Final   Influenza B by PCR NEGATIVE NEGATIVE Final    Comment: (NOTE) The Xpert Xpress SARS-CoV-2/FLU/RSV plus assay is intended as an aid in the diagnosis of influenza from Nasopharyngeal swab specimens and should not be used as a sole basis for treatment. Nasal washings and aspirates are unacceptable for Xpert Xpress SARS-CoV-2/FLU/RSV testing.  Fact Sheet for Patients: EntrepreneurPulse.com.au  Fact Sheet for Healthcare Providers: IncredibleEmployment.be  This test is not yet approved or cleared by the [place] FDA and has been authorized for detection and/or diagnosis of SARS-CoV-2  by FDA under an Emergency Use Authorization (EUA). This EUA will remain in effect (meaning this test can be used) for the duration of the COVID-19 declaration under Section 564(b)(1) of the Act, 21 U.S.C. section 360bbb-3(b)(1), unless the authorization is terminated or revoked.  Performed at [facility], [address], [place], [place] [address]     Labs: CBC: Recent Labs  Lab 05/03/21 1755  WBC 15.4*  NEUTROABS 13.3*  HGB 15.9  HCT 46.5  MCV 83.9  PLT 123456   Basic Metabolic Panel: Recent Labs  Lab 05/03/21 1755  NA 140  K 4.6  CL 105  CO2 26  GLUCOSE 142*  BUN 20  CREATININE 1.10  CALCIUM 9.3   Liver Function Tests: Recent Labs  Lab 05/03/21 1755  AST 19  ALT 28  ALKPHOS 74  BILITOT 0.5  PROT 7.1  ALBUMIN 4.8   CBG: No results for input(s): GLUCAP in the last 168 hours.  Discharge time spent: greater than 30 minutes.  Signed:  Laurel Dimmer  Wynelle Link MD.  [facility] 05/05/2021

## 2021-05-05 NOTE — Telephone Encounter (Signed)
Patient is scheduled to see Dr Katrinka Blazing on 05/14/2020 as a new patient.  He has seen [facility] and [facility] as well. Working on getting past records from them  I received this message from the patient's mother this morning: This is [employer], Christin. His back pain was uncontrolled on Christmas day, I took him to [facility] at [place]. They sent him by carelink to [facility] ED. They tried 3 times to get MRI the pain too severe and panic. They gave him ketamine to get him through the CT scanner just to try to see something until anesthesia sedate him to get MRI. They admitted him, sedated him, got MRI. Consulted neurosurgery who looked at all scans said to continue admit, pain management, IV steroids with taper to oral. Was getting Dilaudid and put on fentanyl with lidocaine patches with goal for pain control. He is finally little comfortable. Plan see how he is here today see how he does later. Red wants to go home but I am hoping will keep him another day. I was also hoping Neuro will see him before he leaves with I am not 100 sure they will. He was dragging his leg with significant weakness and incontinent. The nerves affected would not have caused the incontinence just couldn't get to br in to time.   I am addressing a better pain regimen because I know fentanyl patches are not long term, they just said until he sees neurosurgeron. I know what happens after hospital discharged with a plan like that.   With all this happening. Can Dr Katrinka Blazing review everything. Our goal is to avoid surgery, holistically get on a better plan for lifetime maintenance.     Please advise..  (His mom is a patient of Dr Katrinka Blazing as well)

## 2021-05-05 NOTE — Telephone Encounter (Signed)
Sent patient MyChart message with recommendations.  

## 2021-05-08 LAB — CULTURE, BLOOD (ROUTINE X 2)
Culture: NO GROWTH
Culture: NO GROWTH
Special Requests: ADEQUATE
Special Requests: ADEQUATE

## 2021-05-10 ENCOUNTER — Emergency Department (HOSPITAL_BASED_OUTPATIENT_CLINIC_OR_DEPARTMENT_OTHER)
Admission: EM | Admit: 2021-05-10 | Discharge: 2021-05-10 | Disposition: A | Discharge status: Home / Self Care | Payer: BC Managed Care – PPO | Attending: Emergency Medicine | Admitting: Emergency Medicine

## 2021-05-10 DIAGNOSIS — I951 Orthostatic hypotension: Secondary | ICD-10-CM | POA: Diagnosis not present

## 2021-05-10 DIAGNOSIS — R42 Dizziness and giddiness: Secondary | ICD-10-CM

## 2021-05-10 DIAGNOSIS — R55 Syncope and collapse: Secondary | ICD-10-CM | POA: Diagnosis present

## 2021-05-10 LAB — CBC
HCT: 50.2 % (ref 39.0–52.0)
Hemoglobin: 17.3 g/dL — ABNORMAL HIGH (ref 13.0–17.0)
MCH: 28.3 pg (ref 26.0–34.0)
MCHC: 34.5 g/dL (ref 30.0–36.0)
MCV: 82 fL (ref 80.0–100.0)
Platelets: 329 10*3/uL (ref 150–400)
RBC: 6.12 MIL/uL — ABNORMAL HIGH (ref 4.22–5.81)
RDW: 13.2 % (ref 11.5–15.5)
WBC: 18.1 10*3/uL — ABNORMAL HIGH (ref 4.0–10.5)
nRBC: 0 % (ref 0.0–0.2)

## 2021-05-10 LAB — BASIC METABOLIC PANEL WITH GFR
Anion gap: 12 (ref 5–15)
BUN: 12 mg/dL (ref 6–20)
CO2: 22 mmol/L (ref 22–32)
Calcium: 9.7 mg/dL (ref 8.9–10.3)
Chloride: 103 mmol/L (ref 98–111)
Creatinine, Ser: 0.79 mg/dL (ref 0.61–1.24)
GFR, Estimated: >60 mL/min
Glucose, Bld: 100 mg/dL — ABNORMAL HIGH (ref 70–99)
Potassium: 4 mmol/L (ref 3.5–5.1)
Sodium: 137 mmol/L (ref 135–145)

## 2021-05-10 LAB — URINALYSIS, ROUTINE W REFLEX MICROSCOPIC
Bilirubin Urine: NEGATIVE
Glucose, UA: NEGATIVE mg/dL
Hgb urine dipstick: NEGATIVE
Ketones, ur: NEGATIVE mg/dL
Leukocytes,Ua: NEGATIVE
Nitrite: NEGATIVE
Protein, ur: NEGATIVE mg/dL
Specific Gravity, Urine: 1.019 (ref 1.005–1.030)
pH: 6.5 (ref 5.0–8.0)

## 2021-05-10 LAB — CBG MONITORING, ED: Glucose-Capillary: 99 mg/dL (ref 70–99)

## 2021-05-10 MED ORDER — SODIUM CHLORIDE 0.9 % IV BOLUS
2000.0000 mL | Freq: Once | INTRAVENOUS | Status: AC
Start: 2021-05-10 — End: 2021-05-10
  Administered 2021-05-10: 2000 mL via INTRAVENOUS

## 2021-05-10 NOTE — ED Provider Notes (Signed)
[facility] DEPT Provider Note   CSN: [id number] Arrival date & time: 05/10/21  1549     History  Chief Complaint  Patient presents with   Near Syncope    ABAD EDGE is a 26 y.o. male.  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  Patient has been on oral Dilaudid and fentanyl patches for pain control recently as well as continued steroids.  Plan is for outpatient follow-up with neurosurgery.  Over the past couple of days, patient has had lightheaded spells, especially when he sits or stands up.  He had visit by EMS last night and was given 500 cc fluid bolus.  Symptoms persisted today without chest pain or shortness of breath.  He states that he generally just does not feel well.  No recent nausea, vomiting, or diarrhea.  He has been continuing to urinate.        Home Medications Prior to Admission medications   Medication Sig Start Date End Date Taking? Authorizing Provider  albuterol (VENTOLIN HFA) 108 (90 Base) MCG/ACT inhaler Inhale 2 puffs into the lungs every 6 (six) hours as needed for wheezing or shortness of breath.    [provider]  docusate sodium (COLACE) 100 MG capsule Take 1 tablet once or twice daily as needed for constipation while taking narcotic pain medicine Patient not taking: Reported on 05/03/2021 03/12/19   Hinda Kehr, MD  fentaNYL (DURAGESIC) 12 MCG/HR Place 1 patch onto the skin every 3 (three) days. With 3rd patch, cut in half and use only half for 3 days.  Then use other half.  Then for 4th patch, cut into 1/4ths and use only 1.  Then stop. 05/07/21   Annita Brod, MD  gabapentin (NEURONTIN) 300 MG capsule  Take 300 mg by mouth 3 (three) times daily.    [provider]  HYDROmorphone (DILAUDID) 2 MG tablet Take 0.5 tablets (1 mg total) by mouth every 6 (six) hours as needed for up to 5 days (breakthrough pain). 05/05/21 05/10/21  Annita Brod, MD  lidocaine (LIDODERM) 5 % Place 1 patch onto the skin daily. On for 12 hours during day, off for 12 hours and evening/night 05/06/21   Annita Brod, MD  methylPREDNISolone (MEDROL DOSEPAK) 4 MG TBPK tablet Day 1: 4 mg after dinner, 8 mg hs, Day 2: 4 mg tid after meals, 8 mg hs, Day 3: 4 mg TID after meals and HS, Day 4: 4 mg after breakfast and lunch and HS, Day 5: 4 mg after breakfast and at night, Day 6: 4 mg after breakfast 05/05/21   Annita Brod, MD  polyethylene glycol (MIRALAX / GLYCOLAX) 17 g packet Take 17 g by mouth daily. 05/06/21   Annita Brod, MD  VYVANSE 40 MG capsule Take 40 mg by mouth daily as needed (for work).    [provider]      Allergies    Patient has no known allergies.    Review of Systems   Review of Systems  Constitutional:  Negative for fever.  HENT:  Negative for rhinorrhea and sore throat.   Eyes:  Negative for redness.  Respiratory:  Negative for cough.   Cardiovascular:  Negative for chest pain.  Gastrointestinal:  Negative for abdominal pain, diarrhea, nausea and vomiting.  Genitourinary:  Negative for dysuria and hematuria.  Musculoskeletal:  Positive for back pain. Negative for myalgias.  Skin:  Negative for rash.  Neurological:  Positive for weakness (generalized) and light-headedness. Negative for headaches.  Psychiatric/Behavioral:  The patient is nervous/anxious.    Physical Exam Updated Vital Signs BP 130/68 (BP Location: Right Arm)    Pulse 85    Temp 97.8 F (36.6 C)    Resp 20    Ht 6\' 3"  (1.905 m)    Wt (!) 142.9 kg    SpO2 99%    BMI 39.37 kg/m   Physical Exam Vitals and nursing note reviewed.  Constitutional:      General: He is not in acute distress.     Appearance: He is well-developed.  HENT:     Head: Normocephalic and atraumatic.     Mouth/Throat:     Mouth: Mucous membranes are moist.  Eyes:     General:        Right eye: No discharge.        Left eye: No discharge.     Conjunctiva/sclera: Conjunctivae normal.  Cardiovascular:     Rate and Rhythm: Normal rate and regular rhythm.     Heart sounds: Normal heart sounds.  Pulmonary:     Effort: Pulmonary effort is normal.     Breath sounds: Normal breath sounds.  Abdominal:     Palpations: Abdomen is soft.     Tenderness: There is no abdominal tenderness.  Musculoskeletal:     Cervical back: Normal range of motion and neck supple.  Skin:    General: Skin is warm and dry.  Neurological:     Mental Status: He is alert.    ED Results / Procedures / Treatments   Labs (all labs ordered are listed, but only abnormal results are displayed) Labs Reviewed  BASIC METABOLIC PANEL - Abnormal; Notable for the following components:      Result Value   Glucose, Bld 100 (*)    All other components within normal limits  CBC - Abnormal; Notable for the following components:   WBC 18.1 (*)    RBC 6.12 (*)    Hemoglobin 17.3 (*)    All other components within normal limits  URINALYSIS, ROUTINE W REFLEX MICROSCOPIC  CBG MONITORING, ED    ED ECG REPORT   Date: 05/10/2021  Rate: 85  Rhythm: normal sinus rhythm  QRS Axis: normal  Intervals: normal  ST/T Wave abnormalities: early repolarization  Conduction Disutrbances:none  Narrative Interpretation:   Old EKG Reviewed: none available  I have personally reviewed the EKG tracing and agree with the computerized printout as noted.   Radiology No results found.  Procedures Procedures    Medications Ordered in ED Medications  sodium chloride 0.9 % bolus 2,000 mL (has no administration in time range)    ED Course/ Medical Decision Making/ A&P    Patient seen and examined. Plan discussed with patient.   Labs: Ordered in  triage, reviewed.  White blood cell count elevated likely related to ongoing steroid use.  Hemoglobin elevated, possibly related to dehydration however creatinine is reassuring.  Patient is having urinary output.  EKG ordered, pending.  Imaging: None  Medications/Fluids: 2 L normal saline ordered  Vital signs reviewed and are as follows: BP 130/68 (BP Location: Right Arm)    Pulse 85    Temp 97.8 F (36.6 C)    Resp 20  Ht 6\' 3"  (1.905 m)    Wt (!) 142.9 kg    SpO2 99%    BMI 39.37 kg/m   Initial impression: Orthostatic lightheadedness, low concern for PE  EKG personally reviewed, no old for comparison however does not show any ischemic findings.  9:51 PM Reassessment performed. Patient appears comfortable.   Currently has received about 1 L of 2 L.  Orthostatic vital signs as below.  Orthostatic VS for the past 24 hrs:  BP- Lying Pulse- Lying BP- Sitting Pulse- Sitting BP- Standing at 0 minutes Pulse- Standing at 0 minutes  05/10/21 2026 (!) 149/98 81 (!) 154/98 105 (!) 148/99 118    Plan: Complete IV hydration over the next hour, ambulate and recheck.  Anticipate discharge to home with current plan.                           Medical Decision Making  Patient with recent admission for back pain, now currently on multiple oral narcotics for pain control, presents with positional lightheadedness over the past day or so.  No chest pain or shortness of breath.  Low concern for PE, ACS.  Possible element of dehydration due to poor oral intake.  Labs are reassuring.          Final Clinical Impression(s) / ED Diagnoses Final diagnoses:  Orthostatic lightheadedness    Rx / DC Orders ED Discharge Orders     None         Carlisle Cater, PA-C 05/10/21 2152    Fredia Sorrow, MD 05/10/21 2259

## 2021-05-10 NOTE — ED Notes (Signed)
Patient made aware that urine sample is needed.  

## 2021-05-10 NOTE — ED Notes (Signed)
EMT-P provided AVS using Teachback Method. Patient verbalizes understanding of Discharge Instructions. Opportunity for Questioning and Answers were provided by EMT-P. Patient Discharged from ED.  ? ?

## 2021-05-10 NOTE — ED Triage Notes (Signed)
Pt recently seen at ED for back pain and has hx of multiple herniated discs. Pt reported feeling faint last night and EMS was called but did not transport. Pt denies syncopal episodes or fall.

## 2021-05-10 NOTE — Discharge Instructions (Addendum)
Please read and follow all provided instructions.  Your diagnoses today include:  1. Orthostatic lightheadedness     Tests performed today include: Complete blood cell count: High white blood cell count, possibly related to steroid use and high hemoglobin, possibly related to some dehydration Basic metabolic panel: normal kidney function and electrolytes Vital signs. See below for your results today.   Medications prescribed:  None  Take any prescribed medications only as directed.  Home care instructions:  Follow any educational materials contained in this packet.  BE VERY CAREFUL not to take multiple medicines containing Tylenol (also called acetaminophen). Doing so can lead to an overdose which can damage your liver and cause liver failure and possibly death.   Follow-up instructions: Please follow-up with your primary care provider in the next 3 days for further evaluation of your symptoms.   Return instructions:  Please return to the Emergency Department if you experience worsening symptoms.  Please return if you have any other emergent concerns.  Additional Information:  Your vital signs today were: BP (!) 148/99    Pulse (!) 113    Temp 97.8 F (36.6 C)    Resp (!) 25    Ht 6\' 3"  (1.905 m)    Wt (!) 142.9 kg    SpO2 100%    BMI 39.37 kg/m  If your blood pressure (BP) was elevated above 135/85 this visit, please have this repeated by your doctor within one month. --------------

## 2021-05-13 NOTE — Progress Notes (Signed)
[facility] [address] [place] [address] Phone: [phone] Subjective:   [name], am serving as a scribe for Dr. Antoine Primas. This visit occurred during the SARS-CoV-2 public health emergency.  Safety protocols were in place, including screening questions prior to the visit, additional usage of staff PPE, and extensive cleaning of exam room while observing appropriate contact time as indicated for disinfecting solutions.   I'm seeing this patient by the request  of:  Lance Bosch, NP  CC: Low back pain  [id number]  Greg[facility] is a 26 y.o. male coming in with complaint of LBP. Suffered fall in April 2022. Recent visits to ED for back pain.  Patient had pain that was out of proportion to the amount of palpation.  Epidural injection on 04/30/2021.  Patient had increasing discomfort and pain with weakness of the left lower extremity with bowel and bladder incontinence.  Patient was admitted on the 25th.  Patient continued to have pain despite multiple doses of Dilaudid, ketamine was not having any significant improvement.  Had to undergo MRI requiring intubation for the lumbar spine showing the below findings.  Patient was discharged on 27 December with lidocaine patches, Dilaudid 2 mg, fentanyl patches as well.  Patient is to have a follow-up with [facility].  Patient was also seen in the emergency room on the first with orthostatic lightheadedness.  Patient did have laboratory work-up at that time that did show patient had a elevated white blood cell count but likely secondary to the steroids he has been on.  Patient states that last April he fell off the back of his truck. 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 Patient was out of work for 3 months after April injury. No relief from epidurals that he had recently. Pain has improved recently over past 3 days. Patient states that he was recently put on anxiety  medication that we feels works well for him.   MRI lumbar 05/04/2021 IMPRESSION: 1. No acute findings within the thoracic or lumbar spine. 2. 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 Resultant moderate-severe left and mild right foraminal stenosis at this level. 3. No canal stenosis at any level within the thoracic or lumbar spine.    MRI thoracic 05/04/2021 IMPRESSION: 1. No acute findings within the thoracic or lumbar spine. 2. 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 Resultant moderate-severe left and mild right foraminal stenosis at this level. 3. No canal stenosis at any level within the thoracic or lumbar spine.      Past Medical History:  Diagnosis Date   History of kidney stones    Past Surgical History:  Procedure Laterality Date   EUSTACHIAN TUBE DILATION Bilateral    x4   RADIOLOGY WITH ANESTHESIA N/A 05/04/2021   Procedure: MRI WITH ANESTHESIA;  Surgeon: Julieanne Cotton, MD;  Location: [facility] OR;  Service: Radiology;  Laterality: N/A;   WRIST FRACTURE SURGERY Right    Social History   Socioeconomic History   Marital status: Single    Spouse name: Not on file   Number of children: Not on file   Years of education: Not on file   Highest education level: Not on file  Occupational History   Not on file  Tobacco Use   Smoking status: Never   Smokeless tobacco: Never  Vaping Use   Vaping Use: Never used  Substance  and Sexual Activity   Alcohol use: No   Drug use: Not Currently    Types: Marijuana   Sexual activity: Not on file  Other Topics Concern   Not on file  Social History Narrative   Not on file   Social Determinants of Health   Financial Resource Strain: Not on file  Food Insecurity: Not on file  Transportation Needs: Not on file  Physical Activity: Not on file  Stress: Not on file  Social Connections: Not on file   No Known  Allergies Family History  Problem Relation Age of Onset   Gestational diabetes Mother    Bladder Cancer Neg Hx    Kidney cancer Neg Hx    Prostate cancer Neg Hx     Current Outpatient Medications (Endocrine & Metabolic):    methylPREDNISolone (MEDROL DOSEPAK) 4 MG TBPK tablet, Day 1: 4 mg after dinner, 8 mg hs, Day 2: 4 mg tid after meals, 8 mg hs, Day 3: 4 mg TID after meals and HS, Day 4: 4 mg after breakfast and lunch and HS, Day 5: 4 mg after breakfast and at night, Day 6: 4 mg after breakfast   Current Outpatient Medications (Respiratory):    albuterol (VENTOLIN HFA) 108 (90 Base) MCG/ACT inhaler, Inhale 2 puffs into the lungs every 6 (six) hours as needed for wheezing or shortness of breath.  Current Outpatient Medications (Analgesics):    fentaNYL (DURAGESIC) 12 MCG/HR, Place 1 patch onto the skin every 3 (three) days. With 3rd patch, cut in half and use only half for 3 days.  Then use other half.  Then for 4th patch, cut into 1/4ths and use only 1.  Then stop.   Current Outpatient Medications (Other):    docusate sodium (COLACE) 100 MG capsule, Take 1 tablet once or twice daily as needed for constipation while taking narcotic pain medicine   gabapentin (NEURONTIN) 300 MG capsule, Take 300 mg by mouth 3 (three) times daily.   lidocaine (LIDODERM) 5 %, Place 1 patch onto the skin daily. On for 12 hours during day, off for 12 hours and evening/night   polyethylene glycol (MIRALAX / GLYCOLAX) 17 g packet, Take 17 g by mouth daily.   VYVANSE 40 MG capsule, Take 40 mg by mouth daily as needed (for work).   Reviewed prior external information including notes and imaging from  primary care provider As well as notes that were available from care everywhere and other healthcare systems.  As stated above including imaging and most recent hospitalization.  We will also have the fax from [facility] of what they have done in an outpatient setting.  Past medical history, social, surgical  and family history all reviewed in electronic medical record.  No pertanent information unless stated regarding to the chief complaint.   Review of Systems:  No headache, visual changes, nausea, vomiting, diarrhea, constipation, dizziness, abdominal pain, skin rash, fevers, chills, night sweats, weight loss, swollen lymph nodes, body aches, joint swelling, chest pain, shortness of breath, mood changes. POSITIVE muscle aches  Objective  Blood pressure 126/82, pulse (!) 122, height 6\' 3"  (1.905 m), weight (!) 315 lb (142.9 kg), SpO2 97 %.   General: No apparent distress alert and oriented x3 mood and affect normal, dressed appropriately.  HEENT: Pupils equal, extraocular movements intact  Respiratory: Patient's speak in full sentences and does not appear short of breath  Cardiovascular: No lower extremity edema, non tender, no erythema  Gait mild antalgic Left leg does show the  patient does have some weakness noted especially with plantarflexion on the left side.  Some limited dorsiflexion as well with 4 out of 5 strength compared to the contralateral side.  Achilles tendon does appear to be intact but patient does have weakness noted as well of the    Impression and Recommendations:     The above documentation has been reviewed and is accurate and complete Lyndal Pulley, DO

## 2021-05-14 ENCOUNTER — Encounter: Payer: Self-pay | Admitting: Neurology

## 2021-05-14 ENCOUNTER — Encounter: Payer: Self-pay | Admitting: Family Medicine

## 2021-05-14 ENCOUNTER — Other Ambulatory Visit: Payer: Self-pay

## 2021-05-14 ENCOUNTER — Ambulatory Visit (INDEPENDENT_AMBULATORY_CARE_PROVIDER_SITE_OTHER): Payer: BC Managed Care – PPO | Admitting: Family Medicine

## 2021-05-14 VITALS — BP 126/82 | HR 122 | Ht 75.0 in | Wt 315.0 lb

## 2021-05-14 DIAGNOSIS — M5127 Other intervertebral disc displacement, lumbosacral region: Secondary | ICD-10-CM | POA: Diagnosis not present

## 2021-05-14 DIAGNOSIS — R29898 Other symptoms and signs involving the musculoskeletal system: Secondary | ICD-10-CM

## 2021-05-14 DIAGNOSIS — M545 Low back pain, unspecified: Secondary | ICD-10-CM

## 2021-05-14 DIAGNOSIS — R202 Paresthesia of skin: Secondary | ICD-10-CM

## 2021-05-14 NOTE — Assessment & Plan Note (Signed)
Patient does have weakness noted in the S1 distribution on the left side.  At this point I feel that patient has had this pain for greater than 8 months at this point after injury and does not seem to be making improvement.  Has been on fairly sizable doses of gabapentin and is now titrating himself off of the fentanyl and the Dilaudid that patient did have in the emergency room and the hospitalization.  Discussed with patient also doing multiple injections with no significant benefit at this time and he does need to consider the possibility of surgical intervention.  We will order a nerve conduction test as well to further evaluate if surgery is more acutely necessary with patient being such a young age.  Patient was accompanied with mother and we did answer all the questions.  Spent greater than 45 minutes discussing with him as well as reviewing patient's imaging and hospitalization notes.  Patient will follow up with me after meeting with neurosurgery to discuss his other options.

## 2021-05-14 NOTE — Patient Instructions (Addendum)
Referral to neurosurgery-They will call you [facility] will call you Keep decreasing pain meds I'm here if you have questions Neurosurgery is the right place for now

## 2021-05-18 ENCOUNTER — Encounter: Payer: Self-pay | Admitting: Family Medicine

## 2021-05-19 ENCOUNTER — Other Ambulatory Visit: Payer: Self-pay

## 2021-05-19 ENCOUNTER — Telehealth: Payer: Self-pay | Admitting: Family Medicine

## 2021-05-19 DIAGNOSIS — M48061 Spinal stenosis, lumbar region without neurogenic claudication: Secondary | ICD-10-CM

## 2021-05-19 DIAGNOSIS — M5127 Other intervertebral disc displacement, lumbosacral region: Secondary | ICD-10-CM

## 2021-05-19 NOTE — Telephone Encounter (Signed)
Pt mom called [facility] today to follow up on referral. States they will only allow [facility] to see the 2 physicians that were associated with his treatment in the hospital ( although neither actually saw the pt ). She is not comfortable with this and would like to be referred to Neurosurgery at [facility] if Dr. Katrinka Blazing agrees. Is there anyone there we would specifically recommend? Should this be an urgent referral?  She wants neuro and not ortho for the surgery.

## 2021-05-19 NOTE — Telephone Encounter (Signed)
After talking with her co workers, mom would like pt referred to:  [facility] [address] [place] Phone [phone]

## 2021-06-17 ENCOUNTER — Other Ambulatory Visit: Payer: Self-pay

## 2021-06-17 DIAGNOSIS — R202 Paresthesia of skin: Secondary | ICD-10-CM

## 2021-06-18 ENCOUNTER — Other Ambulatory Visit: Payer: Self-pay

## 2021-06-18 ENCOUNTER — Ambulatory Visit: Payer: BC Managed Care – PPO | Admitting: Neurology

## 2021-06-18 DIAGNOSIS — R202 Paresthesia of skin: Secondary | ICD-10-CM | POA: Diagnosis not present

## 2021-06-18 DIAGNOSIS — M5417 Radiculopathy, lumbosacral region: Secondary | ICD-10-CM

## 2021-06-18 NOTE — Procedures (Signed)
[facility]  [address], [address]  [place], [place] [address] Tel: [phone] Fax:  [phone] Test Date:  06/18/2021  Patient: Gregory Fry DOB: [birth year] Physician: Nita Sickle, DO  Sex: Male Height: 6\' 3"  Ref Phys: , DO  ID#: [id number]   Technician:    Patient Complaints: This is a 26 year old man referred for evaluation of left leg radicular pain.  NCV & EMG Findings: Extensive electrodiagnostic testing of the left lower extremity shows:  Left sural and superficial peroneal sensory responses are within normal limits. Left peroneal motor response shows mildly reduced amplitude (2.6 mV) at the extensor digitorum brevis, and is normal at the tibialis anterior.  Left tibial motor response is within normal limits. Left tibial H reflex study is within normal limits. Chronic motor axonal loss changes are seen affecting the left gastroc and biceps femoris short head muscles, without accompanied active denervation.  Impression: Chronic S1 radiculopathy affecting the left lower extremity, mild. There is no evidence of a sensorimotor polyneuropathy affecting the lower extremity.   ___________________________ 30, DO    Nerve Conduction Studies Anti Sensory Summary Table   Stim Site NR Peak (ms) Norm Peak (ms) P-T Amp (V) Norm P-T Amp  Left Sup Peroneal Anti Sensory (Ant Lat Mall)  32C  12 cm    3.0 <4.4 11.2 >6  Left Sural Anti Sensory (Lat Mall)  32C  Calf    3.9 <4.4 22.0 >6   Motor Summary Table   Stim Site NR Onset (ms) Norm Onset (ms) O-P Amp (mV) Norm O-P Amp Site1 Site2 Delta-0 (ms) Dist (cm) Vel (m/s) Norm Vel (m/s)  Left Peroneal Motor (Ext Dig Brev)  32C  Ankle    4.8 <5.5 2.6 >3 B Fib Ankle 9.3 40.0 43 >41  B Fib    14.1  2.1  Poplt B Fib 1.8 10.0 56 >41  Poplt    15.9  2.1         Left Peroneal TA Motor (Tib Ant)  32C  Fib Head    3.8 <4.0 5.2 >4 Poplit Fib Head 0.9 10.0 111 >41  Poplit    4.7  5.3         Left  Tibial Motor (Abd Hall Brev)  32C  Ankle    4.1 <5.8 14.0 >8 Knee Ankle 11.3 46.0 41 >41  Knee    15.4  11.6          H Reflex Studies   NR H-Lat (ms) Lat Norm (ms) L-R H-Lat (ms)  Left Tibial (Gastroc)  32C     34.29 <35    EMG   Side Muscle Ins Act Fibs Psw Fasc Number Recrt Dur Dur. Amp Amp. Poly Poly. Comment  Left AntTibialis Nml Nml Nml Nml Nml Nml Nml Nml Nml Nml Nml Nml N/A  Left Gastroc Nml Nml Nml Nml 1- Rapid Some 1+ Some 1+ Some 1+ N/A  Left Flex Dig Long Nml Nml Nml Nml Nml Nml Nml Nml Nml Nml Nml Nml N/A  Left RectFemoris Nml Nml Nml Nml Nml Nml Nml Nml Nml Nml Nml Nml N/A  Left GluteusMed Nml Nml Nml Nml Nml Nml Nml Nml Nml Nml Nml Nml N/A  Left BicepsFemS Nml Nml Nml Nml 1- Rapid Some 1+ Some 1+ Some 1+ N/A      Waveforms:

## 2021-06-21 ENCOUNTER — Encounter: Payer: Self-pay | Admitting: Family Medicine

## 2021-07-24 DIAGNOSIS — F411 Generalized anxiety disorder: Secondary | ICD-10-CM | POA: Insufficient documentation

## 2021-07-24 DIAGNOSIS — F429 Obsessive-compulsive disorder, unspecified: Secondary | ICD-10-CM | POA: Insufficient documentation

## 2021-10-11 ENCOUNTER — Other Ambulatory Visit: Payer: Self-pay

## 2021-10-11 ENCOUNTER — Encounter (HOSPITAL_BASED_OUTPATIENT_CLINIC_OR_DEPARTMENT_OTHER): Payer: Self-pay

## 2021-10-11 ENCOUNTER — Emergency Department (HOSPITAL_BASED_OUTPATIENT_CLINIC_OR_DEPARTMENT_OTHER): Payer: BC Managed Care – PPO

## 2021-10-11 ENCOUNTER — Emergency Department (HOSPITAL_BASED_OUTPATIENT_CLINIC_OR_DEPARTMENT_OTHER)
Admission: EM | Admit: 2021-10-11 | Discharge: 2021-10-11 | Disposition: A | Discharge status: Home / Self Care | Payer: BC Managed Care – PPO | Attending: Emergency Medicine | Admitting: Emergency Medicine

## 2021-10-11 DIAGNOSIS — D72829 Elevated white blood cell count, unspecified: Secondary | ICD-10-CM | POA: Insufficient documentation

## 2021-10-11 DIAGNOSIS — R109 Unspecified abdominal pain: Secondary | ICD-10-CM | POA: Diagnosis present

## 2021-10-11 DIAGNOSIS — N200 Calculus of kidney: Secondary | ICD-10-CM | POA: Diagnosis not present

## 2021-10-11 DIAGNOSIS — R7309 Other abnormal glucose: Secondary | ICD-10-CM | POA: Insufficient documentation

## 2021-10-11 DIAGNOSIS — J45909 Unspecified asthma, uncomplicated: Secondary | ICD-10-CM | POA: Insufficient documentation

## 2021-10-11 LAB — URINALYSIS, ROUTINE W REFLEX MICROSCOPIC
Bilirubin Urine: NEGATIVE
Glucose, UA: NEGATIVE mg/dL
Ketones, ur: NEGATIVE mg/dL
Leukocytes,Ua: NEGATIVE
Nitrite: NEGATIVE
Specific Gravity, Urine: 1.022 (ref 1.005–1.030)
pH: 5.5 (ref 5.0–8.0)

## 2021-10-11 LAB — CBC
HCT: 45.7 % (ref 39.0–52.0)
Hemoglobin: 15.4 g/dL (ref 13.0–17.0)
MCH: 27.8 pg (ref 26.0–34.0)
MCHC: 33.7 g/dL (ref 30.0–36.0)
MCV: 82.5 fL (ref 80.0–100.0)
Platelets: 262 10*3/uL (ref 150–400)
RBC: 5.54 MIL/uL (ref 4.22–5.81)
RDW: 12.6 % (ref 11.5–15.5)
WBC: 17.4 10*3/uL — ABNORMAL HIGH (ref 4.0–10.5)
nRBC: 0 % (ref 0.0–0.2)

## 2021-10-11 LAB — BASIC METABOLIC PANEL
Anion gap: 9 (ref 5–15)
BUN: 12 mg/dL (ref 6–20)
CO2: 25 mmol/L (ref 22–32)
Calcium: 9.1 mg/dL (ref 8.9–10.3)
Chloride: 106 mmol/L (ref 98–111)
Creatinine, Ser: 0.85 mg/dL (ref 0.61–1.24)
GFR, Estimated: >60 mL/min (ref >60)
Glucose, Bld: 122 mg/dL — ABNORMAL HIGH (ref 70–99)
Potassium: 3.8 mmol/L (ref 3.5–5.1)
Sodium: 140 mmol/L (ref 135–145)

## 2021-10-11 MED ORDER — OXYCODONE-ACETAMINOPHEN 5-325 MG PO TABS: 1.0000 | ORAL_TABLET | Freq: Four times a day (QID) | ORAL | 0 refills | Status: DC | PRN

## 2021-10-11 MED ORDER — ONDANSETRON HCL 4 MG/2ML IJ SOLN
4.0000 mg | Freq: Once | INTRAMUSCULAR | Status: AC
  Administered 2021-10-11: 4 mg via INTRAVENOUS
  Filled 2021-10-11: qty 2

## 2021-10-11 MED ORDER — LACTATED RINGERS IV BOLUS
1000.0000 mL | Freq: Once | INTRAVENOUS | Status: AC
  Administered 2021-10-11: 1000 mL via INTRAVENOUS

## 2021-10-11 MED ORDER — TAMSULOSIN HCL 0.4 MG PO CAPS: 0.4000 mg | ORAL_CAPSULE | Freq: Every day | ORAL | 0 refills | Status: DC

## 2021-10-11 MED ORDER — KETOROLAC TROMETHAMINE 15 MG/ML IJ SOLN
15.0000 mg | Freq: Once | INTRAMUSCULAR | Status: AC
  Administered 2021-10-11: 15 mg via INTRAVENOUS
  Filled 2021-10-11: qty 1

## 2021-10-11 MED ORDER — ONDANSETRON HCL 4 MG PO TABS: 4.0000 mg | ORAL_TABLET | Freq: Four times a day (QID) | ORAL | 0 refills | Status: DC

## 2021-10-11 MED ORDER — FENTANYL CITRATE PF 50 MCG/ML IJ SOSY
50.0000 ug | PREFILLED_SYRINGE | INTRAMUSCULAR | Status: AC | PRN
  Administered 2021-10-11 (×2): 50 ug via INTRAVENOUS
  Filled 2021-10-11 (×2): qty 1

## 2021-10-11 NOTE — ED Notes (Signed)
Pt. Was PO challenged and tolerated well.  

## 2021-10-11 NOTE — Discharge Instructions (Addendum)
You were seen in the emergency department for evaluation of your right flank pain for possible kidney stone.  You did have a kidney stone visible on your CT scan.  Because of this, we have prescribed you a couple medications.  I am prescribing tamsulosin to take once nightly.  Additionally I prescribed you some Zofran which is some antinausea medication to take as needed.  Additionally, I prescribed you some narcotic pain medication called Percocet to take if you have any breakthrough pain that Tylenol or ibuprofen is not helping.  Please do not operate heavy machinery, drive, or make important decisions while this medication as it can make you drowsy.  Included information for Dr. Lenoria Chime office which is with [facility] for you to call to schedule an appointment with.  Please call tomorrow.  If you have any concern, new or worsening symptoms, please return to the nearest emergency department for reevaluation.  Contact a health care provider if: You have pain that gets worse or does not get better with medicine. Get help right away if: You have a fever or chills. You develop severe pain. You develop new abdominal pain. You faint. You are unable to urinate.

## 2021-10-11 NOTE — ED Triage Notes (Signed)
Pt presents POV with Right flank pain, nausea and vomiting x1 hour. Pt reports he last urinated 30 mins PTA.  Pt reports hx of kidney stones, 8 total, last flare up 6 months ago, denies surgery

## 2021-10-11 NOTE — ED Provider Notes (Cosign Needed Addendum)
[facility]ENC[facility] DEPT Provider Note   CSN: [id number] Arrival date & time: 10/11/21  1241     History Chief Complaint  Patient presents with   Flank Pain   Nausea    Gregory Fry is a 26 y.o. male with history of kidney stones and asthma presents to the emergency department for evaluation of right flank pain radiating to his right lower abdomen and into his testicles.  The patient reports that his pain typically radiates into his testicles with kidney stones.  Patient concerned for kidney stone as this felt like his previous swelling.  He reports that his last kidney stone was on the left and it was approximately 6 months ago.  He reports that he did have a urologist at [facility], however he did not follow-up.  He reports he has been getting recurrent kidney stones the past 3 years.  He reports some nausea and vomiting but denies any hematemesis or any coffee-ground emesis.  Denies any fever, hematuria, dysuria.  He denies any testicular or penile swelling or discharge.  Denies any scrotal swelling.  Denies any testicular pain.  Denies any surgical history.  No known drug allergies.  Occasional marijuana user.   Flank Pain Associated symptoms include abdominal pain. Pertinent negatives include no chest pain, no headaches and no shortness of breath.      Home Medications Prior to Admission medications   Medication Sig Start Date End Date Taking? Authorizing Provider  albuterol (VENTOLIN HFA) 108 (90 Base) MCG/ACT inhaler Inhale 2 puffs into the lungs every 6 (six) hours as needed for wheezing or shortness of breath.    [provider]  docusate sodium (COLACE) 100 MG capsule Take 1 tablet once or twice daily as needed for constipation while taking narcotic pain medicine 03/12/19   Hinda Kehr, MD  fentaNYL (DURAGESIC) 12 MCG/HR Place 1 patch onto the skin every 3 (three) days. With 3rd patch, cut in half and use only half for 3 days.  Then use other half.   Then for 4th patch, cut into 1/4ths and use only 1.  Then stop. 05/07/21   Annita Brod, MD  gabapentin (NEURONTIN) 300 MG capsule Take 300 mg by mouth 3 (three) times daily.    [provider]  lidocaine (LIDODERM) 5 % Place 1 patch onto the skin daily. On for 12 hours during day, off for 12 hours and evening/night 05/06/21   Annita Brod, MD  methylPREDNISolone (MEDROL DOSEPAK) 4 MG TBPK tablet Day 1: 4 mg after dinner, 8 mg hs, Day 2: 4 mg tid after meals, 8 mg hs, Day 3: 4 mg TID after meals and HS, Day 4: 4 mg after breakfast and lunch and HS, Day 5: 4 mg after breakfast and at night, Day 6: 4 mg after breakfast 05/05/21   Annita Brod, MD  polyethylene glycol (MIRALAX / GLYCOLAX) 17 g packet Take 17 g by mouth daily. 05/06/21   Annita Brod, MD  VYVANSE 40 MG capsule Take 40 mg by mouth daily as needed (for work).    [provider]      Allergies    Patient has no known allergies.    Review of Systems   Review of Systems  Constitutional:  Negative for chills and fever.  Respiratory:  Negative for shortness of breath.   Cardiovascular:  Negative for chest pain.  Gastrointestinal:  Positive for abdominal pain, nausea and vomiting.  Genitourinary:  Positive for flank pain. Negative for dysuria,  frequency, hematuria and urgency.  Neurological:  Negative for syncope and headaches.   Physical Exam Updated Vital Signs BP 139/63   Pulse (!) 58   Temp 97.9 F (36.6 C) (Oral)   Resp 18   SpO2 95%  Physical Exam Vitals and nursing note reviewed.  Constitutional:      Appearance: Normal appearance.     Comments: Uncomfortable, but no acute distress.  HENT:     Head: Normocephalic and atraumatic.     Mouth/Throat:     Mouth: Mucous membranes are moist.  Eyes:     General: No scleral icterus. Cardiovascular:     Rate and Rhythm: Normal rate and regular rhythm.  Pulmonary:     Effort: Pulmonary effort is normal.     Breath sounds: Normal  breath sounds.  Abdominal:     General: Abdomen is flat. Bowel sounds are normal.     Palpations: Abdomen is soft.     Tenderness: There is no abdominal tenderness. There is right CVA tenderness. There is no left CVA tenderness, guarding or rebound.     Hernia: There is no hernia in the left inguinal area or right inguinal area.     Comments: Mild right CVA tenderness.  No abdominal tenderness.  Abdomen is soft.  Normal active bowel sounds.  Genitourinary:    Penis: Normal.      Testes: Normal.        Right: Tenderness or swelling not present.        Left: Tenderness or swelling not present.  Musculoskeletal:        General: No deformity.     Cervical back: Normal range of motion.  Skin:    General: Skin is warm and dry.  Neurological:     General: No focal deficit present.     Mental Status: He is alert. Mental status is at baseline.    ED Results / Procedures / Treatments   Labs (all labs ordered are listed, but only abnormal results are displayed) Labs Reviewed  BASIC METABOLIC PANEL - Abnormal; Notable for the following components:      Result Value   Glucose, Bld 122 (*)    All other components within normal limits  CBC - Abnormal; Notable for the following components:   WBC 17.4 (*)    All other components within normal limits  URINALYSIS, ROUTINE W REFLEX MICROSCOPIC    EKG None  Radiology CT Renal Stone Study  Result Date: 10/11/2021 CLINICAL DATA:  Flank pain.  Kidney stone suspected. EXAM: CT ABDOMEN AND PELVIS WITHOUT CONTRAST TECHNIQUE: Multidetector CT imaging of the abdomen and pelvis was performed following the standard protocol without IV contrast. RADIATION DOSE REDUCTION: This exam was performed according to the departmental dose-optimization program which includes automated exposure control, adjustment of the mA and/or kV according to patient size and/or use of iterative reconstruction technique. COMPARISON:  03/11/2019 FINDINGS: Lower chest: Clear lung  bases. Normal heart size without pericardial or pleural effusion. Hepatobiliary: Hepatomegaly at greater than 20 cm. Normal gallbladder, without biliary ductal dilatation. Pancreas: Normal, without mass or ductal dilatation. Spleen: Normal in size, without focal abnormality. Adrenals/Urinary Tract: Normal adrenal glands. Punctate right renal collecting system calculi. Mild right hydroureter to the level of a proximal right ureteric 4 mm stone on 61/2. No bladder calculi. Stomach/Bowel: Normal stomach, without wall thickening. Normal colon, appendix, and terminal ileum. Normal small bowel. Vascular/Lymphatic: Normal caliber of the aorta and branch vessels. Mildly prominent ileocolic mesenteric nodes are within normal variation  for age. Reproductive: Normal prostate. Other: No significant free fluid.  No free intraperitoneal air. Musculoskeletal: Bilateral L5 pars defects. IMPRESSION: 1. Mild right-sided urinary tract obstruction secondary to a proximal right ureteric 4 mm stone. 2. Right nephrolithiasis. 3. Hepatomegaly. Electronically Signed   By: Abigail Miyamoto M.D.   On: 10/11/2021 14:41    Procedures Procedures   Medications Ordered in ED Medications  fentaNYL (SUBLIMAZE) injection 50 mcg (50 mcg Intravenous Given 10/11/21 1346)  ondansetron (ZOFRAN) injection 4 mg (4 mg Intravenous Given 10/11/21 1305)  ketorolac (TORADOL) 15 MG/ML injection 15 mg (15 mg Intravenous Given 10/11/21 1519)  lactated ringers bolus 1,000 mL (1,000 mLs Intravenous New Bag/Given 10/11/21 1517)    ED Course/ Medical Decision Making/ A&P                           Medical Decision Making Amount and/or Complexity of Data Reviewed Labs: ordered. Radiology: ordered.  Risk Prescription drug management.    26 year old male presents to the emergency department for evaluation of right flank pain radiating to his right groin since earlier today.  Differential diagnosis includes was not limited to appendicitis, testicular torsion,  nephrolithiasis, urethral lithiasis, UTI, GI, diverticulitis, colitis.  Vital signs are unremarkable.  Patient normotensive, afebrile, normal pulse rate, satting well on room air without any increased work of breathing.  Physical exam is pertinent for some mild right CVA tenderness.  Patient is uncomfortable appearing, otherwise nontoxic.  He has no abdominal guarding or rebound tenderness.  GU exam shows normal penile exam with normal testicles.  No erythema, induration, swelling, or penile drip noted.  Josh as Producer, television/film/video.  Given his history of kidney stones and the fact the patient reports that this feels ago his last kidney stone, high suspicion.  Will order CT renal as well as basic labs.  I independently reviewed and interpreted the patient's labs and imaging and agree with radiologist interpretation.  BMP shows mildly increased glucose at 122 although not fasting.  No abnormality in his electrolytes.  CBC shows mildly elevated CBC at 18.4, could be acute stress reaction given his pain.  Urinalysis shows urinalysis shows large amount of hemoglobin with trace protein, 6-10 white-red blood cells seen.  No ketones.  Patient was initially given fentanyl and Zofran with nursing orders given his vomiting and nausea upon presentation.  I have since ordered him 1 L LR and as well as some Toradol.  CT imaging shows mild right-sided urinary tract obstruction secondary to a proximal right ureteric 4 mm stone.  There is right nephrolithiasis and hepatomegaly visualized as well.  Given the stone and the patient's history, I did speak to Dr. Diona Fanti. Who recommended the patient would be safe for discharge with outpatient follow up with urology.   Patient reported improvement in pain after the Toradol.  The patient reports that he typically has pain that radiates into his testicles with his kidney stone which is now gone away since the Toradol.  He reports that this is typical for him.  I likely think this pain  radiating to his testicle is from his kidney stone I do not think any testicular torsion given that he has a normal GU exam without any pain on palpation.  PO challenge was performed and passed. Discharge delay due to the patient sleeping and not providing a urine sample.   The lab and images with the patient.  Discussed the need to follow-up with urology outpatient.  I  included this information in his discharge paperwork.  We will prescribe him some tamsulosin, Zofran, and a few narcotic pain medication to get him through these next few days.  Again, stressed that he will need follow-up with urologist to find out why he keeps having these recurrent kidney stone.  We discussed strict return precautions discussed and red flag symptoms.  The patient verbalized understanding and agrees to plan.  Patient is stable and being discharged home in good condition.  Final Clinical Impression(s) / ED Diagnoses Final diagnoses:  Kidney stone    Rx / DC Orders ED Discharge Orders          Ordered    ondansetron (ZOFRAN) 4 MG tablet  Every 6 hours        10/11/21 1916    oxyCODONE-acetaminophen (PERCOCET/ROXICET) 5-325 MG tablet  Every 6 hours PRN        10/11/21 1916    tamsulosin (FLOMAX) 0.4 MG CAPS capsule  Daily after breakfast        10/11/21 1916              Sherrell Puller, PA-C 10/11/21 1918    Sherrell Puller, PA-C 10/11/21 1919    Lacretia Leigh, MD 10/12/21 1502

## 2021-10-12 ENCOUNTER — Inpatient Hospital Stay (HOSPITAL_BASED_OUTPATIENT_CLINIC_OR_DEPARTMENT_OTHER)
Admission: EM | Admit: 2021-10-12 | Discharge: 2021-10-14 | DRG: 694 | Disposition: A | Discharge status: Home / Self Care | Payer: BC Managed Care – PPO | Attending: Family Medicine | Admitting: Family Medicine

## 2021-10-12 ENCOUNTER — Encounter (HOSPITAL_BASED_OUTPATIENT_CLINIC_OR_DEPARTMENT_OTHER): Payer: Self-pay

## 2021-10-12 ENCOUNTER — Other Ambulatory Visit: Payer: Self-pay

## 2021-10-12 DIAGNOSIS — Z888 Allergy status to other drugs, medicaments and biological substances status: Secondary | ICD-10-CM | POA: Diagnosis not present

## 2021-10-12 DIAGNOSIS — N132 Hydronephrosis with renal and ureteral calculous obstruction: Principal | ICD-10-CM | POA: Diagnosis present

## 2021-10-12 DIAGNOSIS — F32A Depression, unspecified: Secondary | ICD-10-CM | POA: Diagnosis present

## 2021-10-12 DIAGNOSIS — F429 Obsessive-compulsive disorder, unspecified: Secondary | ICD-10-CM | POA: Diagnosis not present

## 2021-10-12 DIAGNOSIS — Z79899 Other long term (current) drug therapy: Secondary | ICD-10-CM | POA: Diagnosis not present

## 2021-10-12 DIAGNOSIS — Z6839 Body mass index (BMI) 39.0-39.9, adult: Secondary | ICD-10-CM | POA: Diagnosis not present

## 2021-10-12 DIAGNOSIS — F410 Panic disorder [episodic paroxysmal anxiety] without agoraphobia: Secondary | ICD-10-CM | POA: Diagnosis present

## 2021-10-12 DIAGNOSIS — Z87442 Personal history of urinary calculi: Secondary | ICD-10-CM

## 2021-10-12 DIAGNOSIS — F909 Attention-deficit hyperactivity disorder, unspecified type: Secondary | ICD-10-CM | POA: Diagnosis present

## 2021-10-12 DIAGNOSIS — N200 Calculus of kidney: Secondary | ICD-10-CM

## 2021-10-12 DIAGNOSIS — R1031 Right lower quadrant pain: Secondary | ICD-10-CM | POA: Diagnosis not present

## 2021-10-12 DIAGNOSIS — M5126 Other intervertebral disc displacement, lumbar region: Secondary | ICD-10-CM | POA: Diagnosis present

## 2021-10-12 DIAGNOSIS — D72829 Elevated white blood cell count, unspecified: Secondary | ICD-10-CM | POA: Diagnosis present

## 2021-10-12 DIAGNOSIS — E669 Obesity, unspecified: Secondary | ICD-10-CM | POA: Diagnosis not present

## 2021-10-12 DIAGNOSIS — N201 Calculus of ureter: Principal | ICD-10-CM | POA: Diagnosis present

## 2021-10-12 DIAGNOSIS — M545 Low back pain, unspecified: Secondary | ICD-10-CM | POA: Diagnosis present

## 2021-10-12 LAB — COMPREHENSIVE METABOLIC PANEL
ALT: 37 U/L (ref 0–44)
AST: 28 U/L (ref 15–41)
Albumin: 4.7 g/dL (ref 3.5–5.0)
Alkaline Phosphatase: 56 U/L (ref 38–126)
Anion gap: 11 (ref 5–15)
BUN: 15 mg/dL (ref 6–20)
CO2: 24 mmol/L (ref 22–32)
Calcium: 9.5 mg/dL (ref 8.9–10.3)
Chloride: 105 mmol/L (ref 98–111)
Creatinine, Ser: 1.11 mg/dL (ref 0.61–1.24)
GFR, Estimated: >60 mL/min (ref >60)
Glucose, Bld: 105 mg/dL — ABNORMAL HIGH (ref 70–99)
Potassium: 3.7 mmol/L (ref 3.5–5.1)
Sodium: 140 mmol/L (ref 135–145)
Total Bilirubin: 0.4 mg/dL (ref 0.3–1.2)
Total Protein: 7.2 g/dL (ref 6.5–8.1)

## 2021-10-12 LAB — URINALYSIS, ROUTINE W REFLEX MICROSCOPIC
Bilirubin Urine: NEGATIVE
Glucose, UA: NEGATIVE mg/dL
Ketones, ur: NEGATIVE mg/dL
Leukocytes,Ua: NEGATIVE
Nitrite: NEGATIVE
Protein, ur: NEGATIVE mg/dL
Specific Gravity, Urine: 1.024 (ref 1.005–1.030)
pH: 6.5 (ref 5.0–8.0)

## 2021-10-12 LAB — CBC
HCT: 45 % (ref 39.0–52.0)
Hemoglobin: 15.1 g/dL (ref 13.0–17.0)
MCH: 27.5 pg (ref 26.0–34.0)
MCHC: 33.6 g/dL (ref 30.0–36.0)
MCV: 82 fL (ref 80.0–100.0)
Platelets: 270 10*3/uL (ref 150–400)
RBC: 5.49 MIL/uL (ref 4.22–5.81)
RDW: 12.4 % (ref 11.5–15.5)
WBC: 13 10*3/uL — ABNORMAL HIGH (ref 4.0–10.5)
nRBC: 0 % (ref 0.0–0.2)

## 2021-10-12 MED ORDER — KETOROLAC TROMETHAMINE 15 MG/ML IJ SOLN
15.0000 mg | Freq: Once | INTRAMUSCULAR | Status: AC
  Administered 2021-10-12: 15 mg via INTRAVENOUS
  Filled 2021-10-12: qty 1

## 2021-10-12 MED ORDER — LACTATED RINGERS IV SOLN: INTRAVENOUS | Status: DC

## 2021-10-12 MED ORDER — HYDROMORPHONE HCL 1 MG/ML IJ SOLN
1.0000 mg | INTRAMUSCULAR | Status: AC | PRN
  Administered 2021-10-12 (×2): 1 mg via INTRAVENOUS
  Filled 2021-10-12 (×2): qty 1

## 2021-10-12 MED ORDER — ONDANSETRON HCL 4 MG/2ML IJ SOLN
4.0000 mg | Freq: Once | INTRAMUSCULAR | Status: AC
  Administered 2021-10-12: 4 mg via INTRAVENOUS
  Filled 2021-10-12: qty 2

## 2021-10-12 MED ORDER — LACTATED RINGERS IV BOLUS
1000.0000 mL | Freq: Once | INTRAVENOUS | Status: AC
  Administered 2021-10-12: 1000 mL via INTRAVENOUS

## 2021-10-12 MED ORDER — FENTANYL CITRATE PF 50 MCG/ML IJ SOSY
50.0000 ug | PREFILLED_SYRINGE | INTRAMUSCULAR | Status: DC | PRN
  Administered 2021-10-12: 50 ug via INTRAVENOUS
  Filled 2021-10-12 (×2): qty 1

## 2021-10-12 MED ORDER — RINGERS IV SOLN: INTRAVENOUS | Status: DC

## 2021-10-12 NOTE — ED Notes (Signed)
Report given to [facility]. 

## 2021-10-12 NOTE — ED Notes (Signed)
Pt stated that he thinks he can provide a urine sample in a few mins.

## 2021-10-12 NOTE — ED Notes (Signed)
Pt refused Fent, stated that it doesn't work and requested Toradol. Provider informed and he ordered.

## 2021-10-12 NOTE — ED Notes (Signed)
Report given to the floor. 

## 2021-10-12 NOTE — ED Notes (Addendum)
Pt informed of need for urine. Unable at the moment.

## 2021-10-12 NOTE — ED Provider Notes (Addendum)
[facility]   CSN: [id number] Arrival date & time: 10/12/21  1502     History  Chief Complaint  Patient presents with   Flank Pain    Gregory RobertsHunter N Fry is a 26 y.o. male.   Flank Pain   26 year old male presenting to the emergency department with persistent pain associated with a kidney stone that was diagnosed in the emergency department yesterday.  He states that he continues to have right lower quadrant abdominal pain and right flank pain that is severe, 8 out of 10 with nausea and vomiting.  He has been able to keep any of his home medications down.  He endorses chills.  Home Medications Prior to Admission medications   Medication Sig Start Date End Date Taking? Authorizing Provider  albuterol (VENTOLIN HFA) 108 (90 Base) MCG/ACT inhaler Inhale 2 puffs into the lungs every 6 (six) hours as needed for wheezing or shortness of breath.    [provider]  docusate sodium (COLACE) 100 MG capsule Take 1 tablet once or twice daily as needed for constipation while taking narcotic pain medicine 03/12/19   Loleta RoseForbach, Cory, MD  fentaNYL (DURAGESIC) 12 MCG/HR Place 1 patch onto the skin every 3 (three) days. With 3rd patch, cut in half and use only half for 3 days.  Then use other half.  Then for 4th patch, cut into 1/4ths and use only 1.  Then stop. 05/07/21   Hollice EspyKrishnan, Sendil K, MD  gabapentin (NEURONTIN) 300 MG capsule Take 300 mg by mouth 3 (three) times daily.    [provider]  lidocaine (LIDODERM) 5 % Place 1 patch onto the skin daily. On for 12 hours during day, off for 12 hours and evening/night 05/06/21   Hollice EspyKrishnan, Sendil K, MD  methylPREDNISolone (MEDROL DOSEPAK) 4 MG TBPK tablet Day 1: 4 mg after dinner, 8 mg hs, Day 2: 4 mg tid after meals, 8 mg hs, Day 3: 4 mg TID after meals and HS, Day 4: 4 mg after breakfast and lunch and HS, Day 5: 4 mg after breakfast and at night, Day 6: 4 mg after breakfast 05/05/21   Hollice EspyKrishnan,  Sendil K, MD  ondansetron (ZOFRAN) 4 MG tablet Take 1 tablet (4 mg total) by mouth every 6 (six) hours. 10/11/21   Achille Richansom, Riley, PA-C  oxyCODONE-acetaminophen (PERCOCET/ROXICET) 5-325 MG tablet Take 1 tablet by mouth every 6 (six) hours as needed for severe pain. 10/11/21   Achille Richansom, Riley, PA-C  polyethylene glycol (MIRALAX / GLYCOLAX) 17 g packet Take 17 g by mouth daily. 05/06/21   Hollice EspyKrishnan, Sendil K, MD  tamsulosin (FLOMAX) 0.4 MG CAPS capsule Take 1 capsule (0.4 mg total) by mouth daily after breakfast. 10/11/21   Achille Richansom, Riley, PA-C  VYVANSE 40 MG capsule Take 40 mg by mouth daily as needed (for work).    [provider]      Allergies    Patient has no known allergies.    Review of Systems   Review of Systems  Genitourinary:  Positive for flank pain.  All other systems reviewed and are negative.  Physical Exam Updated Vital Signs BP 129/74   Pulse 74   Temp 97.9 F (36.6 C)   Resp (!) 24   Ht 6\' 3"  (1.905 m)   Wt (!) 142.9 kg   SpO2 100%   BMI 39.37 kg/m  Physical Exam Vitals and nursing note reviewed.  Constitutional:      General: He is not in acute distress.  Appearance: He is well-developed.  HENT:     Head: Normocephalic and atraumatic.  Eyes:     Conjunctiva/sclera: Conjunctivae normal.  Cardiovascular:     Rate and Rhythm: Normal rate and regular rhythm.  Pulmonary:     Effort: Pulmonary effort is normal. No respiratory distress.  Abdominal:     Palpations: Abdomen is soft.     Tenderness: There is abdominal tenderness. There is right CVA tenderness and guarding.  Musculoskeletal:        General: No swelling.     Cervical back: Neck supple.  Skin:    General: Skin is warm and dry.     Capillary Refill: Capillary refill takes less than 2 seconds.  Neurological:     Mental Status: He is alert.  Psychiatric:        Mood and Affect: Mood normal.    ED Results / Procedures / Treatments   Labs (all labs ordered are listed, but only abnormal  results are displayed) Labs Reviewed  CBC - Abnormal; Notable for the following components:      Result Value   WBC 13.0 (*)    All other components within normal limits  COMPREHENSIVE METABOLIC PANEL - Abnormal; Notable for the following components:   Glucose, Bld 105 (*)    All other components within normal limits  URINE CULTURE  URINALYSIS, ROUTINE W REFLEX MICROSCOPIC    EKG None  Radiology CT Renal Stone Study  Result Date: 10/11/2021 CLINICAL DATA:  Flank pain.  Kidney stone suspected. EXAM: CT ABDOMEN AND PELVIS WITHOUT CONTRAST TECHNIQUE: Multidetector CT imaging of the abdomen and pelvis was performed following the standard protocol without IV contrast. RADIATION DOSE REDUCTION: This exam was performed according to the departmental dose-optimization program which includes automated exposure control, adjustment of the mA and/or kV according to patient size and/or use of iterative reconstruction technique. COMPARISON:  03/11/2019 FINDINGS: Lower chest: Clear lung bases. Normal heart size without pericardial or pleural effusion. Hepatobiliary: Hepatomegaly at greater than 20 cm. Normal gallbladder, without biliary ductal dilatation. Pancreas: Normal, without mass or ductal dilatation. Spleen: Normal in size, without focal abnormality. Adrenals/Urinary Tract: Normal adrenal glands. Punctate right renal collecting system calculi. Mild right hydroureter to the level of a proximal right ureteric 4 mm stone on 61/2. No bladder calculi. Stomach/Bowel: Normal stomach, without wall thickening. Normal colon, appendix, and terminal ileum. Normal small bowel. Vascular/Lymphatic: Normal caliber of the aorta and branch vessels. Mildly prominent ileocolic mesenteric nodes are within normal variation for age. Reproductive: Normal prostate. Other: No significant free fluid.  No free intraperitoneal air. Musculoskeletal: Bilateral L5 pars defects. IMPRESSION: 1. Mild right-sided urinary tract obstruction  secondary to a proximal right ureteric 4 mm stone. 2. Right nephrolithiasis. 3. Hepatomegaly. Electronically Signed   By: Jeronimo Greaves M.D.   On: 10/11/2021 14:41    Procedures Procedures    Medications Ordered in ED Medications  HYDROmorphone (DILAUDID) injection 1 mg (has no administration in time range)  ondansetron (ZOFRAN) injection 4 mg (4 mg Intravenous Given 10/12/21 1601)  ketorolac (TORADOL) 15 MG/ML injection 15 mg (15 mg Intravenous Given 10/12/21 1631)  lactated ringers bolus 1,000 mL (1,000 mLs Intravenous New Bag/Given 10/12/21 1644)    ED Course/ Medical Decision Making/ A&P Clinical Course as of 10/12/21 1746  Mon Oct 12, 2021  1731 WBC(!): 13.0 [JL]    Clinical Course User Index [JL] Ernie Avena, MD  Medical Decision Making Amount and/or Complexity of Data Reviewed Labs: ordered. Decision-making details documented in ED Course.  Risk Prescription drug management. Decision regarding hospitalization.   26 year old male presenting to the emergency department with persistent pain associated with a kidney stone that was diagnosed in the emergency department yesterday.  He states that he continues to have right lower quadrant abdominal pain and right flank pain that is severe, 8 out of 10 with nausea and vomiting.  He has been able to keep any of his home medications down.  He endorses chills.  On arrival, the patient was afebrile, hemodynamically stable, not meeting SIRS criteria.  Presenting with persistent nausea and vomiting and right lower quadrant and right flank pain after a known diagnosis of nephrolithiasis.  He had been found to have a proximal right 4 mm ureteral stone yesterday upon evaluation in the ED.  He has been able to keep down any of his home medications.  He presents with similar symptoms.  He endorses chills but has no fevers.  Low concern for sepsis at this time.  He is unable to provide a urine sample in the emergency  department.  I do not think repeat CT imaging is warranted at this time because he just had CT imaging yesterday that diagnosed his condition and his symptoms are similar.  The patient was administered IV Toradol IV Zofran and an IV fluid bolus.  Dilaudid was ordered for further pain control.  I did speak with on-call urology, Dr. Venetia Constable who agreed with the plan for admission for pain control, further management plan pending.  Hospitalist medicine was consulted for admission for pain control in the setting of nephrolithiasis/ureterolithiasis. Dr. Alinda Money accepted the patient in admission.  Final Clinical Impression(s) / ED Diagnoses Final diagnoses:  Nephrolithiasis  Ureterolithiasis    Rx / DC Orders ED Discharge Orders     None         Ernie Avena, MD 10/12/21 1746    Ernie Avena, MD 10/12/21 [phone]

## 2021-10-12 NOTE — ED Notes (Signed)
Pt again informed of need to obtain a urine sample, again unable.

## 2021-10-12 NOTE — ED Triage Notes (Signed)
Pt presents with R sided flank pain and vomiting since yesterday. Pt diagnosed with kidney stone yesterday but has been unable to keep any medications down.

## 2021-10-13 DIAGNOSIS — N132 Hydronephrosis with renal and ureteral calculous obstruction: Secondary | ICD-10-CM | POA: Diagnosis present

## 2021-10-13 DIAGNOSIS — R1031 Right lower quadrant pain: Secondary | ICD-10-CM | POA: Diagnosis present

## 2021-10-13 DIAGNOSIS — N201 Calculus of ureter: Secondary | ICD-10-CM

## 2021-10-13 DIAGNOSIS — E669 Obesity, unspecified: Secondary | ICD-10-CM | POA: Diagnosis present

## 2021-10-13 DIAGNOSIS — F32A Depression, unspecified: Secondary | ICD-10-CM | POA: Diagnosis present

## 2021-10-13 DIAGNOSIS — F909 Attention-deficit hyperactivity disorder, unspecified type: Secondary | ICD-10-CM | POA: Diagnosis present

## 2021-10-13 DIAGNOSIS — Z87442 Personal history of urinary calculi: Secondary | ICD-10-CM | POA: Diagnosis not present

## 2021-10-13 DIAGNOSIS — Z6839 Body mass index (BMI) 39.0-39.9, adult: Secondary | ICD-10-CM | POA: Diagnosis not present

## 2021-10-13 DIAGNOSIS — F410 Panic disorder [episodic paroxysmal anxiety] without agoraphobia: Secondary | ICD-10-CM | POA: Diagnosis present

## 2021-10-13 DIAGNOSIS — D72829 Elevated white blood cell count, unspecified: Secondary | ICD-10-CM | POA: Diagnosis present

## 2021-10-13 DIAGNOSIS — F429 Obsessive-compulsive disorder, unspecified: Secondary | ICD-10-CM | POA: Diagnosis present

## 2021-10-13 DIAGNOSIS — M5126 Other intervertebral disc displacement, lumbar region: Secondary | ICD-10-CM | POA: Diagnosis present

## 2021-10-13 DIAGNOSIS — N200 Calculus of kidney: Secondary | ICD-10-CM

## 2021-10-13 DIAGNOSIS — Z79899 Other long term (current) drug therapy: Secondary | ICD-10-CM | POA: Diagnosis not present

## 2021-10-13 DIAGNOSIS — M5442 Lumbago with sciatica, left side: Secondary | ICD-10-CM | POA: Diagnosis not present

## 2021-10-13 DIAGNOSIS — Z888 Allergy status to other drugs, medicaments and biological substances status: Secondary | ICD-10-CM | POA: Diagnosis not present

## 2021-10-13 LAB — CBC WITH DIFFERENTIAL/PLATELET
Abs Immature Granulocytes: 0.04 10*3/uL (ref 0.00–0.07)
Basophils Absolute: 0.1 10*3/uL (ref 0.0–0.1)
Basophils Relative: 0 %
Eosinophils Absolute: 0.1 10*3/uL (ref 0.0–0.5)
Eosinophils Relative: 1 %
HCT: 42.7 % (ref 39.0–52.0)
Hemoglobin: 14 g/dL (ref 13.0–17.0)
Immature Granulocytes: 0 %
Lymphocytes Relative: 16 %
Lymphs Abs: 1.8 10*3/uL (ref 0.7–4.0)
MCH: 27.8 pg (ref 26.0–34.0)
MCHC: 32.8 g/dL (ref 30.0–36.0)
MCV: 84.7 fL (ref 80.0–100.0)
Monocytes Absolute: 1.5 10*3/uL — ABNORMAL HIGH (ref 0.1–1.0)
Monocytes Relative: 13 %
Neutro Abs: 8.2 10*3/uL — ABNORMAL HIGH (ref 1.7–7.7)
Neutrophils Relative %: 70 %
Platelets: 228 10*3/uL (ref 150–400)
RBC: 5.04 MIL/uL (ref 4.22–5.81)
RDW: 12.7 % (ref 11.5–15.5)
WBC: 11.7 10*3/uL — ABNORMAL HIGH (ref 4.0–10.5)
nRBC: 0 % (ref 0.0–0.2)

## 2021-10-13 LAB — COMPREHENSIVE METABOLIC PANEL
ALT: 37 U/L (ref 0–44)
AST: 26 U/L (ref 15–41)
Albumin: 3.9 g/dL (ref 3.5–5.0)
Alkaline Phosphatase: 62 U/L (ref 38–126)
Anion gap: 7 (ref 5–15)
BUN: 17 mg/dL (ref 6–20)
CO2: 25 mmol/L (ref 22–32)
Calcium: 8.9 mg/dL (ref 8.9–10.3)
Chloride: 106 mmol/L (ref 98–111)
Creatinine, Ser: 1.23 mg/dL (ref 0.61–1.24)
GFR, Estimated: >60 mL/min (ref >60)
Glucose, Bld: 96 mg/dL (ref 70–99)
Potassium: 3.8 mmol/L (ref 3.5–5.1)
Sodium: 138 mmol/L (ref 135–145)
Total Bilirubin: 1 mg/dL (ref 0.3–1.2)
Total Protein: 6.4 g/dL — ABNORMAL LOW (ref 6.5–8.1)

## 2021-10-13 LAB — MAGNESIUM: Magnesium: 1.8 mg/dL (ref 1.7–2.4)

## 2021-10-13 MED ORDER — DOCUSATE SODIUM 100 MG PO CAPS
100.0000 mg | ORAL_CAPSULE | Freq: Two times a day (BID) | ORAL | Status: DC
  Administered 2021-10-13 – 2021-10-14 (×3): 100 mg via ORAL
  Filled 2021-10-13 (×3): qty 1

## 2021-10-13 MED ORDER — KETOROLAC TROMETHAMINE 15 MG/ML IJ SOLN
15.0000 mg | Freq: Once | INTRAMUSCULAR | Status: AC
Start: 2021-10-13 — End: 2021-10-13
  Administered 2021-10-13: 15 mg via INTRAVENOUS
  Filled 2021-10-13: qty 1

## 2021-10-13 MED ORDER — TAMSULOSIN HCL 0.4 MG PO CAPS
0.4000 mg | ORAL_CAPSULE | Freq: Every day | ORAL | Status: DC
  Administered 2021-10-13 – 2021-10-14 (×2): 0.4 mg via ORAL
  Filled 2021-10-13 (×2): qty 1

## 2021-10-13 MED ORDER — HYDROMORPHONE HCL 1 MG/ML IJ SOLN
0.5000 mg | INTRAMUSCULAR | Status: DC | PRN
  Administered 2021-10-13 – 2021-10-14 (×6): 0.5 mg via INTRAVENOUS
  Filled 2021-10-13 (×6): qty 0.5

## 2021-10-13 MED ORDER — ACETAMINOPHEN 650 MG RE SUPP: 650.0000 mg | Freq: Four times a day (QID) | RECTAL | Status: DC | PRN

## 2021-10-13 MED ORDER — KETOROLAC TROMETHAMINE 15 MG/ML IJ SOLN
15.0000 mg | Freq: Four times a day (QID) | INTRAMUSCULAR | Status: DC | PRN
  Administered 2021-10-13 – 2021-10-14 (×4): 15 mg via INTRAVENOUS
  Filled 2021-10-13 (×4): qty 1

## 2021-10-13 MED ORDER — FLUVOXAMINE MALEATE 50 MG PO TABS
100.0000 mg | ORAL_TABLET | Freq: Every day | ORAL | Status: DC
Start: 2021-10-13 — End: 2021-10-14
  Administered 2021-10-13: 100 mg via ORAL
  Filled 2021-10-13: qty 2

## 2021-10-13 MED ORDER — HYDROXYZINE HCL 25 MG PO TABS: 25.0000 mg | ORAL_TABLET | Freq: Three times a day (TID) | ORAL | Status: DC | PRN

## 2021-10-13 MED ORDER — ACETAMINOPHEN 325 MG PO TABS: 650.0000 mg | ORAL_TABLET | Freq: Four times a day (QID) | ORAL | Status: DC | PRN

## 2021-10-13 MED ORDER — FLUVOXAMINE MALEATE 50 MG PO TABS
50.0000 mg | ORAL_TABLET | Freq: Every morning | ORAL | Status: DC
Start: 2021-10-13 — End: 2021-10-14
  Administered 2021-10-13 – 2021-10-14 (×2): 50 mg via ORAL
  Filled 2021-10-13 (×2): qty 1

## 2021-10-13 MED ORDER — SODIUM CHLORIDE 0.9 % IV SOLN: INTRAVENOUS | Status: DC

## 2021-10-13 MED ORDER — FLUVOXAMINE MALEATE 50 MG PO TABS: 50.0000 mg | ORAL_TABLET | ORAL | Status: DC

## 2021-10-13 MED ORDER — NALOXONE HCL 0.4 MG/ML IJ SOLN: 0.4000 mg | INTRAMUSCULAR | Status: DC | PRN

## 2021-10-13 MED ORDER — ONDANSETRON HCL 4 MG/2ML IJ SOLN
4.0000 mg | Freq: Four times a day (QID) | INTRAMUSCULAR | Status: DC | PRN
  Administered 2021-10-13 – 2021-10-14 (×3): 4 mg via INTRAVENOUS
  Filled 2021-10-13 (×3): qty 2

## 2021-10-13 MED ORDER — LISDEXAMFETAMINE DIMESYLATE 50 MG PO CAPS
50.0000 mg | ORAL_CAPSULE | ORAL | Status: DC
Start: 2021-10-13 — End: 2021-10-13

## 2021-10-13 MED ORDER — LORAZEPAM 2 MG/ML IJ SOLN: 0.5000 mg | Freq: Four times a day (QID) | INTRAMUSCULAR | Status: DC | PRN

## 2021-10-13 NOTE — Consult Note (Signed)
Urology Consult   Physician requesting consult: Beola CordAlexander Melvin MD  Reason for consult: Right flank pain   History of Present Illness: Gregory Fry is a 26 y.o. with history of nephrolithiasis who presented to the ED for the second time in 24 hours due to right flank pain. Pain began 3 days ago. He initially underwent evaluation on 10/11/21 including CT scan which showed a 4mm mid right ureteral stone with upstream mild hydronephrosis. Urinalysis was negative for infection. He was discharge home from the ED with medical expulsive therapy. His pain persisted and he developed nausea and vomiting prompting him to represent to the ED on 10/12/21. Denies fevers and chills at home  On presentation to the ED he was hemodynamically stable and afebrile. Labwork notable for mild leukocytosis to 13, Cr 1.11 (baseline 0.9), UA negative for infection. A repeat CT scan was not obtained given initial scan was <24 hours prior.   He reports prior history of nephrolithiasis. He has passed ~6 stones spontaneously prior. He has not followed regularly with a urologist.   He denies a history of voiding or storage urinary symptoms, hematuria, UTIs, STDs, GU malignancy/trauma/surgery.  Past Medical History:  Diagnosis Date   History of kidney stones     Past Surgical History:  Procedure Laterality Date   EUSTACHIAN TUBE DILATION Bilateral    x4   RADIOLOGY WITH ANESTHESIA N/A 05/04/2021   Procedure: MRI WITH ANESTHESIA;  Surgeon: Julieanne Cottoneveshwar, Sanjeev, MD;  Location: [facility] OR;  Service: Radiology;  Laterality: N/A;   WRIST FRACTURE SURGERY Right     Current Hospital Medications:  Home Meds:  No current facility-administered medications on file prior to encounter.   Current Outpatient Medications on File Prior to Encounter  Medication Sig Dispense Refill   ondansetron (ZOFRAN) 4 MG tablet Take 1 tablet (4 mg total) by mouth every 6 (six) hours. 12 tablet 0   oxyCODONE-acetaminophen (PERCOCET/ROXICET) 5-325 MG  tablet Take 1 tablet by mouth every 6 (six) hours as needed for severe pain. 5 tablet 0   tamsulosin (FLOMAX) 0.4 MG CAPS capsule Take 1 capsule (0.4 mg total) by mouth daily after breakfast. 14 capsule 0   VYVANSE 40 MG capsule Take 40 mg by mouth daily as needed (for work).     albuterol (VENTOLIN HFA) 108 (90 Base) MCG/ACT inhaler Inhale 2 puffs into the lungs every 6 (six) hours as needed for wheezing or shortness of breath.     docusate sodium (COLACE) 100 MG capsule Take 1 tablet once or twice daily as needed for constipation while taking narcotic pain medicine 30 capsule 0   fentaNYL (DURAGESIC) 12 MCG/HR Place 1 patch onto the skin every 3 (three) days. With 3rd patch, cut in half and use only half for 3 days.  Then use other half.  Then for 4th patch, cut into 1/4ths and use only 1.  Then stop. 5 patch 0   gabapentin (NEURONTIN) 300 MG capsule Take 300 mg by mouth 3 (three) times daily.     lidocaine (LIDODERM) 5 % Place 1 patch onto the skin daily. On for 12 hours during day, off for 12 hours and evening/night 30 patch 0   methylPREDNISolone (MEDROL DOSEPAK) 4 MG TBPK tablet Day 1: 4 mg after dinner, 8 mg hs, Day 2: 4 mg tid after meals, 8 mg hs, Day 3: 4 mg TID after meals and HS, Day 4: 4 mg after breakfast and lunch and HS, Day 5: 4 mg after breakfast and at night, Day  6: 4 mg after breakfast 19 tablet 0   polyethylene glycol (MIRALAX / GLYCOLAX) 17 g packet Take 17 g by mouth daily. 14 each 0     Scheduled Meds: Continuous Infusions:  lactated ringers 125 mL/hr at 10/13/21 0019   PRN Meds:.acetaminophen **OR** acetaminophen, HYDROmorphone (DILAUDID) injection, ketorolac, LORazepam, naLOXone (NARCAN)  injection, ondansetron (ZOFRAN) IV  Allergies: No Known Allergies  Family History  Problem Relation Age of Onset   Gestational diabetes Mother    Bladder Cancer Neg Hx    Kidney cancer Neg Hx    Prostate cancer Neg Hx     Social History:  reports that he has never smoked. He  has never used smokeless tobacco. He reports that he does not currently use drugs after having used the following drugs: Marijuana. He reports that he does not drink alcohol.  ROS: A complete review of systems was performed.  All systems are negative except for pertinent findings as noted.  Physical Exam:  Vital signs in last 24 hours: Temp:  [97.7 F (36.5 C)-97.9 F (36.6 C)] 97.7 F (36.5 C) (06/05 2352) Pulse Rate:  [59-78] 74 (06/05 2352) Resp:  [11-24] 16 (06/05 2352) BP: (124-151)/(67-101) 128/91 (06/05 2352) SpO2:  [95 %-100 %] 97 % (06/05 2352) Weight:  [142.9 kg] 142.9 kg (06/06 0026) Constitutional:  Alert and oriented, No acute distress Cardiovascular: Regular rate and rhythm, No JVD Respiratory: Normal respiratory effort, Lungs clear bilaterally GI: Abdomen is soft, nontender, nondistended, no abdominal masses GU: No CVA tenderness, voiding spontaneously.  Lymphatic: No lymphadenopathy Neurologic: Grossly intact, no focal deficits Psychiatric: Normal mood and affect  Laboratory Data:  Recent Labs    10/11/21 1251 10/12/21 1605  WBC 17.4* 13.0*  HGB 15.4 15.1  HCT 45.7 45.0  PLT 262 270    Recent Labs    10/11/21 1251 10/12/21 1605  NA 140 140  K 3.8 3.7  CL 106 105  GLUCOSE 122* 105*  BUN 12 15  CALCIUM 9.1 9.5  CREATININE 0.85 1.11     Results for orders placed or performed during the hospital encounter of 10/12/21 (from the past 24 hour(s))  CBC     Status: Abnormal   Collection Time: 10/12/21  4:05 PM  Result Value Ref Range   WBC 13.0 (H) 4.0 - 10.5 K/uL   RBC 5.49 4.22 - 5.81 MIL/uL   Hemoglobin 15.1 13.0 - 17.0 g/dL   HCT 93.8 18.2 - 99.3 %   MCV 82.0 80.0 - 100.0 fL   MCH 27.5 26.0 - 34.0 pg   MCHC 33.6 30.0 - 36.0 g/dL   RDW 71.6 96.7 - 89.3 %   Platelets 270 150 - 400 K/uL   nRBC 0.0 0.0 - 0.2 %  Comprehensive metabolic panel     Status: Abnormal   Collection Time: 10/12/21  4:05 PM  Result Value Ref Range   Sodium 140 135 - 145  mmol/L   Potassium 3.7 3.5 - 5.1 mmol/L   Chloride 105 98 - 111 mmol/L   CO2 24 22 - 32 mmol/L   Glucose, Bld 105 (H) 70 - 99 mg/dL   BUN 15 6 - 20 mg/dL   Creatinine, Ser 8.10 0.61 - 1.24 mg/dL   Calcium 9.5 8.9 - 17.5 mg/dL   Total Protein 7.2 6.5 - 8.1 g/dL   Albumin 4.7 3.5 - 5.0 g/dL   AST 28 15 - 41 U/L   ALT 37 0 - 44 U/L   Alkaline Phosphatase 56 38 -  126 U/L   Total Bilirubin 0.4 0.3 - 1.2 mg/dL   GFR, Estimated >56 >81 mL/min   Anion gap 11 5 - 15  Urinalysis, Routine w reflex microscopic     Status: Abnormal   Collection Time: 10/12/21  7:31 PM  Result Value Ref Range   Color, Urine YELLOW YELLOW   APPearance CLEAR CLEAR   Specific Gravity, Urine 1.024 1.005 - 1.030   pH 6.5 5.0 - 8.0   Glucose, UA NEGATIVE NEGATIVE mg/dL   Hgb urine dipstick LARGE (A) NEGATIVE   Bilirubin Urine NEGATIVE NEGATIVE   Ketones, ur NEGATIVE NEGATIVE mg/dL   Protein, ur NEGATIVE NEGATIVE mg/dL   Nitrite NEGATIVE NEGATIVE   Leukocytes,Ua NEGATIVE NEGATIVE   RBC / HPF 21-50 0 - 5 RBC/hpf   WBC, UA 0-5 0 - 5 WBC/hpf   Squamous Epithelial / LPF 0-5 0 - 5   Mucus PRESENT    No results found for this or any previous visit (from the past 240 hour(s)).  Renal Function: Recent Labs    10/11/21 1251 10/12/21 1605  CREATININE 0.85 1.11   Estimated Creatinine Clearance: 153.9 mL/min (by C-G formula based on SCr of 1.11 mg/dL).  Radiologic Imaging: CT Renal Stone Study  Result Date: 10/11/2021 CLINICAL DATA:  Flank pain.  Kidney stone suspected. EXAM: CT ABDOMEN AND PELVIS WITHOUT CONTRAST TECHNIQUE: Multidetector CT imaging of the abdomen and pelvis was performed following the standard protocol without IV contrast. RADIATION DOSE REDUCTION: This exam was performed according to the departmental dose-optimization program which includes automated exposure control, adjustment of the mA and/or kV according to patient size and/or use of iterative reconstruction technique. COMPARISON:  03/11/2019  FINDINGS: Lower chest: Clear lung bases. Normal heart size without pericardial or pleural effusion. Hepatobiliary: Hepatomegaly at greater than 20 cm. Normal gallbladder, without biliary ductal dilatation. Pancreas: Normal, without mass or ductal dilatation. Spleen: Normal in size, without focal abnormality. Adrenals/Urinary Tract: Normal adrenal glands. Punctate right renal collecting system calculi. Mild right hydroureter to the level of a proximal right ureteric 4 mm stone on 61/2. No bladder calculi. Stomach/Bowel: Normal stomach, without wall thickening. Normal colon, appendix, and terminal ileum. Normal small bowel. Vascular/Lymphatic: Normal caliber of the aorta and branch vessels. Mildly prominent ileocolic mesenteric nodes are within normal variation for age. Reproductive: Normal prostate. Other: No significant free fluid.  No free intraperitoneal air. Musculoskeletal: Bilateral L5 pars defects. IMPRESSION: 1. Mild right-sided urinary tract obstruction secondary to a proximal right ureteric 4 mm stone. 2. Right nephrolithiasis. 3. Hepatomegaly. Electronically Signed   By: Jeronimo Greaves M.D.   On: 10/11/2021 14:41    I independently reviewed the above imaging studies.  Impression/Recommendation 26 y/o male with history of nephrolithiasis with right 19mm mid ureteral stone and mild upstream hydronephrosis. No signs of infection, admitted for pain and nausea control.  We discussed the indications for acute intervention including infected obstruction, bilateral ureteral obstruction or unilateral obstruction of solitary kidney as well as other less urgent indications for decompression which would included intractable pain, N/V, and acute renal injury.  He would like to avoid stenting if at all possible. His pain is currently well controlled and his nausea is improving. He would like to try to pass his stone spontaneously for now.   -Please keep patient NPO for now  -Strain all urine  -IV hydration and  flomax to aid in stone passage  -Pain and nausea control. Agree with scheduled tylenol, toradol, prn oxycodone -If patient's symptoms continue to be  well controlled anticipate discharge home with trial of stone passage -Plan for outpatient follow up in 1-2 weeks with KUB and RUS to assess for stone passage -If patient's pain and nausea return or become intractable discussed proceeding acutely with stent placement versus arranging for next available ureteroscopy with laser lithotripsy.   Corinna Burkman 10/13/2021, 3:35 AM

## 2021-10-13 NOTE — Progress Notes (Signed)
  Carryover admission to the Day Admitter.  Patient accepted earlier today by Dr. Alinda Money for transfer from [facility] to [facility] for admit observation to MedS[facility] of this 26 y.o M for pain/nausea control as it relates to a known ureteral stone.  Please see Dr. Vesta Mixer transfer documentation and hospitalist communication for additional details.  Of note, urology has been consulted, and will evaluate the patient in the morning.   I have placed some additional preliminary admit orders via the adult multi-morbid admission order set. I have also ordered continuous lactated Ringer's, as needed IV Dilaudid, prn IV Toradol, prn IV Zofran, as needed IV Ativan for nausea/vomiting refractory to as needed IV Zofran.  Also ordered standard morning labs.  N.p.o. in case urology ultimately decided this for procedure in the setting of patient's ureteral stone.     Newton Pigg, DO Hospitalist

## 2021-10-13 NOTE — Progress Notes (Signed)
Transition of Care [facility]) Screening Note  Patient Details  Name: Gregory Fry Date of Birth: 1995/05/31  Transition of [facility]) CM/SW Contact:    Ewing Schlein, LCSW Phone Number: 10/13/2021, 12:51 PM  Transition of Care Department [facility]) has reviewed patient and no TOC needs have been identified at this time. We will continue to monitor patient advancement through interdisciplinary progression rounds. If new patient transition needs arise, please place a TOC consult.

## 2021-10-13 NOTE — H&P (Signed)
History and Physical    Patient: Gregory Fry HWT:[id number] DOB: [birth year] DOA: 10/12/2021 DOS: the patient was seen and examined on 10/13/2021 PCP: Lance Bosch, NP  Patient coming from: Home  Chief Complaint:  Chief Complaint  Patient presents with   Flank Pain   HPI: Gregory Fry is a 26 y.o. male with medical history significant of current nephrolithiasis, anxiety with depression with associated OCD as well as panic attacks along with ADHD.  Patient also has a history of disc herniation after fall in truck which eventually led to left lower extremity weakness.  He has been evaluated by multiple neurosurgeons and current plan is to undergo repair of this area after he has worked long enough at his current job to be eligible for vacation time/sick time.  Patient has ongoing history of renal calculi and developed right flank and right lower quadrant pain consistent with prior episodes of kidney stones.  Also having chills without fever.  He was seen at [facility] on 6/4 due to ongoing nausea and vomiting.  He was treated with IV fentanyl, Zofran, Toradol and a lactated Ringer's bolus and was subsequently sent home.  CT imaging done during that evaluation revealed a moderate right-sided urinary tract obstruction secondary to a proximal right ureteral stone measuring 4 mm.  EDP did speak with urologist Dr. Retta Diones who stated patient appropriate for discharge home and will need outpatient follow-up with urology.  Was prescribed Zofran and Flomax to help with symptoms.  Return to the ER on 6/5 with intractable nausea and vomiting as well as significant pain not controlled with grossly prescribed Percocet.  Because of intractable pain and inability to keep down medications due to recurrent nausea and vomiting EDP requested hospitalist admit.  Also been evaluated by [facility] urology team who recommends IV fluids and continued n.p.o. status as well as supportive care with IV fluids, Flomax, scheduled Tylenol,  Toradol and as needed IV Dilaudid till able to tolerate oral narcotics such as oxycodone.  I discussed at length with patient and he wishes to continue with conservative management and not proceed with stent placement and wishes to undergo this in the outpatient setting if possible.  Evaluation patient was reporting his level of pain as 4.  He denies nausea and has not vomited for 12 hours.   Review of Systems: As mentioned in the history of present illness. All other systems reviewed and are negative.  Past Medical History:  Diagnosis Date   History of kidney stones    Past Surgical History:  Procedure Laterality Date   EUSTACHIAN TUBE DILATION Bilateral    x4   RADIOLOGY WITH ANESTHESIA N/A 05/04/2021   Procedure: MRI WITH ANESTHESIA;  Surgeon: Julieanne Cotton, MD;  Location: [facility] OR;  Service: Radiology;  Laterality: N/A;   WRIST FRACTURE SURGERY Right    Social History:  reports that he has never smoked. He has never used smokeless tobacco. He reports that he does not currently use drugs after having used the following drugs: Marijuana. He reports that he does not drink alcohol.  Allergies  Allergen Reactions   Clomipramine Other (See Comments)    nightmares    Family History  Problem Relation Age of Onset   Gestational diabetes Mother    Bladder Cancer Neg Hx    Kidney cancer Neg Hx    Prostate cancer Neg Hx     Prior to Admission medications   Medication Sig Start Date End Date Taking? Authorizing Provider  albuterol (VENTOLIN HFA) 108 (  90 Base) MCG/ACT inhaler Inhale 2 puffs into the lungs every 6 (six) hours as needed for wheezing or shortness of breath.   Yes [provider]  docusate sodium (COLACE) 100 MG capsule Take 1 tablet once or twice daily as needed for constipation while taking narcotic pain medicine Patient taking differently: 100 mg 2 (two) times daily as needed (constipation from narcotic pain medications). 03/12/19  Yes Loleta RoseForbach, Cory, MD   fluvoxaMINE (LUVOX) 50 MG tablet Take 50-100 mg by mouth See admin instructions. Take one tablet (50 mg) by mouth every morning and two tablets (100 mg) at night 09/08/21  Yes [provider]  gabapentin (NEURONTIN) 300 MG capsule Take 300 mg by mouth 3 (three) times daily as needed (back pain).   Yes [provider]  hydrOXYzine (ATARAX) 25 MG tablet Take 25 mg by mouth 3 (three) times daily as needed for anxiety. 09/22/21  Yes [provider]  lisdexamfetamine (VYVANSE) 50 MG capsule Take 50 mg by mouth See admin instructions. Take one capsule (50 mg) by mouth daily on work days   Yes [provider]  naproxen sodium (ALEVE) 220 MG tablet Take 220 mg by mouth 2 (two) times daily as needed (back pain).   Yes [provider]  ondansetron (ZOFRAN) 4 MG tablet Take 1 tablet (4 mg total) by mouth every 6 (six) hours. Patient taking differently: Take 4 mg by mouth every 6 (six) hours as needed for nausea or vomiting. 10/11/21  Yes Achille Richansom, Riley, PA-C  oxyCODONE-acetaminophen (PERCOCET/ROXICET) 5-325 MG tablet Take 1 tablet by mouth every 6 (six) hours as needed for severe pain. 10/11/21  Yes Achille Richansom, Riley, PA-C  tamsulosin [facility](FLOMAX) 0.4 MG CAPS capsule Take 1 capsule (0.4 mg total) by mouth daily after breakfast. 10/11/21  Yes Achille Richansom, Riley, PA-C  amphetamine-dextroamphetamine (ADDERALL) 20 MG tablet Take 20 mg by mouth See admin instructions. Take one tablet by mouth daily on work days Patient not taking: Reported on 10/13/2021 09/22/21   [provider]  fentaNYL (DURAGESIC) 12 MCG/HR Place 1 patch onto the skin every 3 (three) days. With 3rd patch, cut in half and use only half for 3 days.  Then use other half.  Then for 4th patch, cut into 1/4ths and use only 1.  Then stop. Patient not taking: Reported on 10/13/2021 05/07/21   Hollice EspyKrishnan, Sendil K, MD  lidocaine (LIDODERM) 5 % Place 1 patch onto the skin daily. On for 12 hours during day, off for 12 hours and  evening/night Patient not taking: Reported on 10/13/2021 05/06/21   Hollice EspyKrishnan, Sendil K, MD    Physical Exam: Vitals:   10/13/21 0026 10/13/21 0355 10/13/21 0406 10/13/21 0903  BP:  (!) 144/77  133/70  Pulse:  67  64  Resp:  16  18  Temp:  97.7 F (36.5 C)  97.7 F (36.5 C)  TempSrc:  Oral  Oral  SpO2:  97%  99%  Weight: (!) 142.9 kg  (!) 138.4 kg   Height: 6\' 3"  (1.905 m)      Constitutional: NAD, calm, comfortable Eyes: PERRL, lids and conjunctivae normal ENMT: Mucous membranes are moist. Posterior pharynx clear of any exudate or lesions.Normal dentition.  Neck: normal, supple, no masses, no thyromegaly Respiratory: clear to auscultation bilaterally, no wheezing, no crackles. Normal respiratory effort. No accessory muscle use.  Cardiovascular: Regular rate and rhythm, no murmurs / rubs / gallops. No extremity edema. 2+ pedal pulses. No carotid bruits.  Abdomen: Mild RLQ pain with palpation, no  masses palpated. No hepatosplenomegaly. Bowel sounds positive.  Musculoskeletal: no clubbing / cyanosis. No joint deformity upper and lower extremities. Good ROM, no contractures. Normal muscle tone.  Skin: no rashes, lesions, ulcers. No induration Neurologic: CN 2-12 grossly intact. Sensation intact, DTR normal. Strength 5/5 x all 4 extremities.  Psychiatric: Normal judgment and insight. Alert and oriented x 3. Normal mood.   Data Reviewed:  Sodium 138, potassium 3.8, CO2 25, BUN 17, creatinine 1.23, magnesium 1.8, WBCs 11,700 without left shift, hemoglobin 14; urinalysis unremarkable except large amount of hemoglobin, specific gravity 1.024, urine culture pending  6/4 CT renal stone stone study: Mild right-sided urinary tract obstruction secondary to proximal right ureter at 4 mm stone with right nephrolithiasis and hepatomegaly  Assessment and Plan:  Obstructive right nephrolithiasis No evidence of hydronephrosis Pain well controlled with IV Dilaudid as well as scheduled Tylenol and  Toradol Appreciate urology consultation At this point patient wishes to pursue conservative therapy and is hopeful can pass stone without surgical intervention-strain on urine Continue IV fluids and Flomax to aid in passage of stone If patient's pain becomes intractable or if he develops significant nausea and vomiting and is unable to keep medications down patient will need to undergo acute stent placement If remains stable and can be discharged plan is to arrange for ureteroscopy and laser lithotripsy next week Continue Zofran for nausea  OCD Continue preadmission Luvox Continue as needed Atarax ask for anxiety  L5 disc herniation No neurological deficits appreciated on exam today Plans elective repair with neurosurgeon      Advance Care Planning:    Code Status: Full Code   Consults: Urology  Family Communication: Mother at bedside  VTE prophylaxis: SCDs  Severity of Illness: The appropriate patient status for this patient is INPATIENT. Inpatient status is judged to be reasonable and necessary in order to provide the required intensity of service to ensure the patient's safety. The patient's presenting symptoms, physical exam findings, and initial radiographic and laboratory data in the context of their chronic comorbidities is felt to place them at high risk for further clinical deterioration. Furthermore, it is not anticipated that the patient will be medically stable for discharge from the hospital within 2 midnights of admission.   * I certify that at the point of admission it is my clinical judgment that the patient will require inpatient hospital care spanning beyond 2 midnights from the point of admission due to high intensity of service, high risk for further deterioration and high frequency of surveillance required.*  Author: Junious Silk, NP 10/13/2021 12:36 PM  For on call review [email or web address].

## 2021-10-14 DIAGNOSIS — M5442 Lumbago with sciatica, left side: Secondary | ICD-10-CM

## 2021-10-14 DIAGNOSIS — N200 Calculus of kidney: Secondary | ICD-10-CM | POA: Diagnosis not present

## 2021-10-14 DIAGNOSIS — N201 Calculus of ureter: Secondary | ICD-10-CM | POA: Diagnosis not present

## 2021-10-14 LAB — URINE CULTURE: Culture: NO GROWTH

## 2021-10-14 MED ORDER — TAMSULOSIN HCL 0.4 MG PO CAPS: 0.4000 mg | ORAL_CAPSULE | Freq: Every day | ORAL | 1 refills | Status: DC

## 2021-10-14 NOTE — Plan of Care (Signed)
Patient being discharged home. PIV removed per order. Discharge teaching completed and all questions answered. All belongings sent with patient.    Problem: Education: Goal: Knowledge of General Education information will improve Description: Including pain rating scale, medication(s)/side effects and non-pharmacologic comfort measures Outcome: Completed/Met   Problem: Health Behavior/Discharge Planning: Goal: Ability to manage health-related needs will improve Outcome: Completed/Met   Problem: Clinical Measurements: Goal: Ability to maintain clinical measurements within normal limits will improve Outcome: Completed/Met Goal: Will remain free from infection Outcome: Completed/Met Goal: Diagnostic test results will improve Outcome: Completed/Met Goal: Respiratory complications will improve Outcome: Completed/Met Goal: Cardiovascular complication will be avoided Outcome: Completed/Met   Problem: Activity: Goal: Risk for activity intolerance will decrease Outcome: Completed/Met   Problem: Nutrition: Goal: Adequate nutrition will be maintained Outcome: Completed/Met   Problem: Coping: Goal: Level of anxiety will decrease Outcome: Completed/Met   Problem: Elimination: Goal: Will not experience complications related to bowel motility Outcome: Completed/Met Goal: Will not experience complications related to urinary retention Outcome: Completed/Met   Problem: Pain Managment: Goal: General experience of comfort will improve Outcome: Completed/Met   Problem: Safety: Goal: Ability to remain free from injury will improve Outcome: Completed/Met   Problem: Skin Integrity: Goal: Risk for impaired skin integrity will decrease Outcome: Completed/Met

## 2021-10-14 NOTE — Discharge Summary (Signed)
Physician Discharge Summary   Patient: Gregory Fry MRN: [id number] DOB: [birth year]  Admit date:     10/12/2021  Discharge date: 10/14/21  Discharge Physician: Meredeth IdeGagan S Adam Demary   PCP: Lance BoschBlevins, Andrea, NP   Recommendations at discharge:   Follow-up urology as outpatient  Discharge Diagnoses: Principal Problem:   Ureterolithiasis Active Problems:   Low back pain   Obesity (BMI 30-39.9)   ADHD   Nephrolithiasis  Resolved Problems:   * No resolved hospital problems. *  Hospital Course:  26 year old male with history of nephrolithiasis came to ED for right flank pain.  Initial CT scan on 10/11/2021 showed 4 mm mid right ureteral stone with upstream mild hydronephrosis.  UA was negative for infection.  He was discharged home with medical therapy.  Patient's pain persisted and developed nausea vomiting and came to ED on 10/12/2021. In the ED UA was negative for infection.  Repeat CT scan was not obtained given he had initial CT scan done less than 24 hours prior.  Patient has history of prior nephrolithiasis.  He has spontaneously passed 6 stones in the past. Urology was consulted.  Assessment and Plan:  Ureteral stone -Pain has improved. -Urology recommend to discharge patient home on Flomax 0.4 mg daily and a urine strainer -Patient to follow-up with urology as outpatient in 2 weeks to assess if the stone has not passed then likely will need ureteroscopy and lithotripsy  Obsessive-compulsive disorder -Continue Luvox  L5 disc herniation -Has history of lumbar disc herniation -No acute neurological deficits noted during this hospitalization -Follow-up neurosurgery as outpatient        Consultants: Urology Procedures performed:  Disposition: Home Diet recommendation:  Discharge Diet Orders (From admission, onward)     Start     Ordered   10/14/21 0000  Diet - low sodium heart healthy        10/14/21 1131           Regular diet DISCHARGE MEDICATION: Allergies as  of 10/14/2021       Reactions   Clomipramine Other (See Comments)   nightmares        Medication List     STOP taking these medications    fentaNYL 12 MCG/HR Commonly known as: DURAGESIC   naproxen sodium 220 MG tablet Commonly known as: ALEVE       TAKE these medications    albuterol 108 (90 Base) MCG/ACT inhaler Commonly known as: VENTOLIN HFA Inhale 2 puffs into the lungs every 6 (six) hours as needed for wheezing or shortness of breath.   amphetamine-dextroamphetamine 20 MG tablet Commonly known as: ADDERALL Take 20 mg by mouth See admin instructions. Take one tablet by mouth daily on work days   docusate sodium 100 MG capsule Commonly known as: Colace Take 1 tablet once or twice daily as needed for constipation while taking narcotic pain medicine What changed:  how much to take when to take this reasons to take this additional instructions   fluvoxaMINE 50 MG tablet Commonly known as: LUVOX Take 50-100 mg by mouth See admin instructions. Take one tablet (50 mg) by mouth every morning and two tablets (100 mg) at night   gabapentin 300 MG capsule Commonly known as: NEURONTIN Take 300 mg by mouth 3 (three) times daily as needed (back pain).   hydrOXYzine 25 MG tablet Commonly known as: ATARAX Take 25 mg by mouth 3 (three) times daily as needed for anxiety.   lidocaine 5 % Commonly known as: LIDODERM Place 1  patch onto the skin daily. On for 12 hours during day, off for 12 hours and evening/night   lisdexamfetamine 50 MG capsule Commonly known as: VYVANSE Take 50 mg by mouth See admin instructions. Take one capsule (50 mg) by mouth daily on work days   ondansetron 4 MG tablet Commonly known as: ZOFRAN Take 1 tablet (4 mg total) by mouth every 6 (six) hours. What changed:  when to take this reasons to take this   oxyCODONE-acetaminophen 5-325 MG tablet Commonly known as: PERCOCET/ROXICET Take 1 tablet by mouth every 6 (six) hours as needed for  severe pain.   tamsulosin 0.4 MG Caps capsule Commonly known as: FLOMAX Take 1 capsule (0.4 mg total) by mouth daily after breakfast.        Follow-up Information     Winter, Dorian Furnace, MD. Schedule an appointment as soon as possible for a visit in 2 week(s).   Specialty: Urology Contact information: [address] [place] [address] [place] [address] [phone]                Discharge Exam: Ceasar Mons Weights   10/13/21 0026 10/13/21 0406 10/14/21 0500  Weight: (!) 142.9 kg (!) 138.4 kg (!) 137.8 kg   General-appears in no acute distress Heart-S1-S2, regular, no murmur auscultated Lungs-clear to auscultation bilaterally, no wheezing or crackles auscultated Abdomen-soft, nontender, no organomegaly Extremities-no edema in the lower extremities Neuro-alert, oriented x3, no focal deficit noted  Condition at discharge: good  The results of significant diagnostics from this hospitalization (including imaging, microbiology, ancillary and laboratory) are listed below for reference.   Imaging Studies: CT Renal Stone Study  Result Date: 10/11/2021 CLINICAL DATA:  Flank pain.  Kidney stone suspected. EXAM: CT ABDOMEN AND PELVIS WITHOUT CONTRAST TECHNIQUE: Multidetector CT imaging of the abdomen and pelvis was performed following the standard protocol without IV contrast. RADIATION DOSE REDUCTION: This exam was performed according to the departmental dose-optimization program which includes automated exposure control, adjustment of the mA and/or kV according to patient size and/or use of iterative reconstruction technique. COMPARISON:  03/11/2019 FINDINGS: Lower chest: Clear lung bases. Normal heart size without pericardial or pleural effusion. Hepatobiliary: Hepatomegaly at greater than 20 cm. Normal gallbladder, without biliary ductal dilatation. Pancreas: Normal, without mass or ductal dilatation. Spleen: Normal in size, without focal abnormality. Adrenals/Urinary Tract: Normal  adrenal glands. Punctate right renal collecting system calculi. Mild right hydroureter to the level of a proximal right ureteric 4 mm stone on 61/2. No bladder calculi. Stomach/Bowel: Normal stomach, without wall thickening. Normal colon, appendix, and terminal ileum. Normal small bowel. Vascular/Lymphatic: Normal caliber of the aorta and branch vessels. Mildly prominent ileocolic mesenteric nodes are within normal variation for age. Reproductive: Normal prostate. Other: No significant free fluid.  No free intraperitoneal air. Musculoskeletal: Bilateral L5 pars defects. IMPRESSION: 1. Mild right-sided urinary tract obstruction secondary to a proximal right ureteric 4 mm stone. 2. Right nephrolithiasis. 3. Hepatomegaly. Electronically Signed   By: Jeronimo Greaves M.D.   On: 10/11/2021 14:41    Microbiology: Results for orders placed or performed during the hospital encounter of 10/12/21  Urine Culture     Status: None   Collection Time: 10/12/21  7:31 PM   Specimen: Urine, Clean Catch  Result Value Ref Range Status   Specimen Description   Final    URINE, CLEAN CATCH Performed at [facility], [address], [place], [place] [address]    Special Requests   Final    NONE Performed at  [facility], [address], [place], [place] [address]    Culture   Final    NO GROWTH Performed at [facility], [address], [place], [place] [address]    Report Status 10/14/2021 FINAL  Final    Labs: CBC: Recent Labs  Lab 10/11/21 1251 10/12/21 1605 10/13/21 0448  WBC 17.4* 13.0* 11.7*  NEUTROABS  --   --  8.2*  HGB 15.4 15.1 14.0  HCT 45.7 45.0 42.7  MCV 82.5 82.0 84.7  PLT 262 270 228   Basic Metabolic Panel: Recent Labs  Lab 10/11/21 1251 10/12/21 1605 10/13/21 0448  NA 140 140 138  K 3.8 3.7 3.8  CL 106 105 106  CO2 25 24 25   GLUCOSE 122* 105* 96  BUN 12 15 17   CREATININE 0.85 1.11 1.23  CALCIUM 9.1 9.5 8.9  MG  --   --  1.8    Liver Function Tests: Recent Labs  Lab 10/12/21 1605 10/13/21 0448  AST 28 26  ALT 37 37  ALKPHOS 56 62  BILITOT 0.4 1.0  PROT 7.2 6.4*  ALBUMIN 4.7 3.9   CBG: No results for input(s): GLUCAP in the last 168 hours.  Discharge time spent: greater than 30 minutes.  Signed: 12/12/21, MD [facility] 10/14/2021

## 2021-10-14 NOTE — Progress Notes (Signed)
  Subjective: Pain still present but improved No nausea or vomiting since admission  Feels like stone is moving lower  Comfortable with discharge home as long as breakfast stays down   Objective: Vital signs in last 24 hours: Temp:  [97.7 F (36.5 C)-98 F (36.7 C)] 98 F (36.7 C) (06/07 0522) Pulse Rate:  [64-76] 76 (06/07 0522) Resp:  [16-18] 16 (06/07 0522) BP: (115-139)/(66-81) 134/76 (06/07 0522) SpO2:  [97 %-100 %] 97 % (06/07 0522) Weight:  [137.8 kg] 137.8 kg (06/07 0500)  Intake/Output from previous day: 06/06 0701 - 06/07 0700 In: 3293.3 [P.O.:60; I.V.:3233.3] Out: 2300 [Urine:2300] Intake/Output this shift: No intake/output data recorded.  Physical Exam:  General: Alert and oriented CV: RRR Lungs: Clear Abdomen: Soft, ND Ext: NT, No erythema  Lab Results: Recent Labs    10/11/21 1251 10/12/21 1605 10/13/21 0448  HGB 15.4 15.1 14.0  HCT 45.7 45.0 42.7   BMET Recent Labs    10/12/21 1605 10/13/21 0448  NA 140 138  K 3.7 3.8  CL 105 106  CO2 24 25  GLUCOSE 105* 96  BUN 15 17  CREATININE 1.11 1.23  CALCIUM 9.5 8.9     Studies/Results: No results found.  Assessment/Plan: 26 y/o male with 33mm right ureteral stone admitted for pain control.   -Okay for discharge home from urologic perspective  -Okay for diet today, no intervention planned -Patient to follow up in 2 weeks with urology in outpatient clinic to reassess symptoms/ stone passage.  -Would like to proceed with ureteroscopy with laser lithotripsy if has not passed stone by clinic visit -Please discharge on flomax 0.4mg  daily and with urine strainer.    LOS: 1 day   Aldine Contes 10/14/2021, 8:26 AM

## 2022-05-28 ENCOUNTER — Ambulatory Visit: Payer: BC Managed Care – PPO | Admitting: "Endocrinology

## 2022-05-28 ENCOUNTER — Encounter: Payer: Self-pay | Admitting: "Endocrinology

## 2022-05-28 VITALS — BP 122/88 | HR 68 | Ht 74.0 in | Wt 310.4 lb

## 2022-05-28 DIAGNOSIS — R7303 Prediabetes: Secondary | ICD-10-CM | POA: Diagnosis not present

## 2022-05-28 DIAGNOSIS — K760 Fatty (change of) liver, not elsewhere classified: Secondary | ICD-10-CM | POA: Insufficient documentation

## 2022-05-28 DIAGNOSIS — E66812 Obesity, class 2: Secondary | ICD-10-CM | POA: Insufficient documentation

## 2022-05-28 DIAGNOSIS — Z6839 Body mass index (BMI) 39.0-39.9, adult: Secondary | ICD-10-CM | POA: Diagnosis not present

## 2022-05-28 LAB — POCT GLYCOSYLATED HEMOGLOBIN (HGB A1C): HbA1c, POC (prediabetic range): 5.5 % — AB (ref 5.7–6.4)

## 2022-05-28 NOTE — Patient Instructions (Signed)

## 2022-05-28 NOTE — Progress Notes (Signed)
Endocrinology Consult Note                                            05/28/2022, 12:43 PM   Subjective:    Patient ID: Gregory Fry, male    DOB: [birth year], PCP Lance Bosch, NP   Past Medical History:  Diagnosis Date   History of kidney stones    Past Surgical History:  Procedure Laterality Date   EUSTACHIAN TUBE DILATION Bilateral    x4   RADIOLOGY WITH ANESTHESIA N/A 05/04/2021   Procedure: MRI WITH ANESTHESIA;  Surgeon: Julieanne Cotton, MD;  Location: [facility] OR;  Service: Radiology;  Laterality: N/A;   WRIST FRACTURE SURGERY Right    Social History   Socioeconomic History   Marital status: Single    Spouse name: Not on file   Number of children: Not on file   Years of education: Not on file   Highest education level: Not on file  Occupational History   Not on file  Tobacco Use   Smoking status: Never   Smokeless tobacco: Never  Vaping Use   Vaping Use: Every day  Substance and Sexual Activity   Alcohol use: No   Drug use: Not Currently    Types: Marijuana   Sexual activity: Not on file  Other Topics Concern   Not on file  Social History Narrative   Not on file   Social Determinants of Health   Financial Resource Strain: Not on file  Food Insecurity: Not on file  Transportation Needs: Not on file  Physical Activity: Not on file  Stress: Not on file  Social Connections: Not on file   Family History  Problem Relation Age of Onset   Thyroid disease Mother    Gestational diabetes Mother    Hypertension Father    Hyperlipidemia Father    Bladder Cancer Neg Hx    Kidney cancer Neg Hx    Prostate cancer Neg Hx    Outpatient Encounter Medications as of 05/28/2022  Medication Sig   fluvoxaMINE (LUVOX) 100 MG tablet Take 100 mg by mouth every morning.   propranolol (INDERAL) 60 MG tablet Take by mouth every morning.   albuterol (VENTOLIN HFA) 108 (90 Base) MCG/ACT inhaler Inhale 2 puffs into the lungs every 6 (six) hours as needed for  wheezing or shortness of breath.   amphetamine-dextroamphetamine (ADDERALL) 20 MG tablet Take 20 mg by mouth See admin instructions. Take one tablet by mouth daily on work days (Patient not taking: Reported on 10/13/2021)   gabapentin (NEURONTIN) 300 MG capsule Take 300 mg by mouth 3 (three) times daily as needed (back pain).   hydrOXYzine (ATARAX) 25 MG tablet Take 25 mg by mouth 3 (three) times daily as needed for anxiety.   ondansetron (ZOFRAN) 4 MG tablet Take 1 tablet (4 mg total) by mouth every 6 (six) hours. (Patient taking differently: Take 4 mg by mouth every 6 (six) hours as needed for nausea or vomiting.)   tamsulosin (FLOMAX) 0.4 MG CAPS capsule Take 1 capsule (0.4 mg total) by mouth daily after breakfast. (Patient taking differently: Take 0.4 mg by mouth daily after breakfast. As needed)   [DISCONTINUED] docusate sodium (COLACE) 100 MG capsule Take 1 tablet once or twice daily as needed for constipation while taking narcotic pain medicine (Patient not taking: Reported on 05/28/2022)   [DISCONTINUED]  fluvoxaMINE (LUVOX) 50 MG tablet Take 50-100 mg by mouth See admin instructions. Take one tablet (50 mg) by mouth every morning and two tablets (100 mg) at night   [DISCONTINUED] lidocaine (LIDODERM) 5 % Place 1 patch onto the skin daily. On for 12 hours during day, off for 12 hours and evening/night (Patient not taking: Reported on 10/13/2021)   [DISCONTINUED] lisdexamfetamine (VYVANSE) 50 MG capsule Take 50 mg by mouth See admin instructions. Take one capsule (50 mg) by mouth daily on work days   [DISCONTINUED] oxyCODONE-acetaminophen (PERCOCET/ROXICET) 5-325 MG tablet Take 1 tablet by mouth every 6 (six) hours as needed for severe pain.   No facility-administered encounter medications on file as of 05/28/2022.   ALLERGIES: Allergies  Allergen Reactions   Clomipramine Other (See Comments)    nightmares    VACCINATION STATUS:  There is no immunization history on file for this  patient.  HPI Gregory Fry is 27 y.o. male who presents today with a medical history as above. he is being seen in consultation for pre-diabetes , history of hypothyroidism, fatty liver, obesity requested by Ferd Hibbs, NP. Patient is accompanied by his mother to clinic.  History is obtained directly from the family as well as chart review. He is not on thyroid hormone nor antithyroid medications.  He has tried metformin in the past for prediabetes.  He is not currently on any  intervention for prediabetes.  His history is such that he has struggled with overweight/obesity for most of his adult life.  He is a former Printmaker including football .  More recently, his weight is fluctuating.  He is not on any particular diet program to lose weight. He is willing to explore his options to manage chronic diseases without medications if possible.  His other medical problems include nephrolithiasis, herniated disc, OCD/ADHD.   Review of Systems  Constitutional: no recent weight gain/loss, no fatigue, no subjective hyperthermia, no subjective hypothermia Eyes: no blurry vision, no xerophthalmia ENT: no sore throat, no nodules palpated in throat, no dysphagia/odynophagia, no hoarseness Cardiovascular: no Chest Pain, no Shortness of Breath, no palpitations, no leg swelling Respiratory: no cough, no shortness of breath Gastrointestinal: no Nausea/Vomiting/Diarhhea Musculoskeletal: no muscle/joint aches Skin: no rashes Neurological: no tremors, no numbness, no tingling, no dizziness Psychiatric: no depression, no anxiety  Objective:       05/28/2022    8:27 AM 10/14/2021    5:22 AM 10/14/2021    5:00 AM  Vitals with BMI  Height 6\' 2"     Weight 310 lbs 6 oz  303 lbs 13 oz  BMI 09.32  35.57  Systolic 322 025   Diastolic 88 76   Pulse 68 76     BP 122/88   Pulse 68   Ht 6\' 2"  (1.88 m)   Wt (!) 310 lb 6.4 oz (140.8 kg)   BMI 39.85 kg/m   Wt Readings from Last 3 Encounters:  05/28/22 (!)  310 lb 6.4 oz (140.8 kg)  10/14/21 (!) 303 lb 12.7 oz (137.8 kg)  05/14/21 (!) 315 lb (142.9 kg)    Physical Exam  Constitutional:  Body mass index is 39.85 kg/m.,  not in acute distress, normal state of mind Eyes: PERRLA, EOMI, no exophthalmos ENT: moist mucous membranes, no gross thyromegaly, no gross cervical lymphadenopathy Cardiovascular: normal precordial activity, Regular Rate and Rhythm, no Murmur/Rubs/Gallops Respiratory:  adequate breathing efforts, no gross chest deformity, Clear to auscultation bilaterally Gastrointestinal: abdomen soft, Non -tender, No distension, Bowel Sounds present,  no gross organomegaly Musculoskeletal: no gross deformities, strength intact in all four extremities Skin: moist, warm, no rashes, + acanthosis nigricans. Neurological: no tremor with outstretched hands, Deep tendon reflexes normal in bilateral lower extremities.  CMP ( most recent) CMP     Component Value Date/Time   NA 138 10/13/2021 0448   K 3.8 10/13/2021 0448   CL 106 10/13/2021 0448   CO2 25 10/13/2021 0448   GLUCOSE 96 10/13/2021 0448   BUN 17 10/13/2021 0448   CREATININE 1.23 10/13/2021 0448   CALCIUM 8.9 10/13/2021 0448   PROT 6.4 (L) 10/13/2021 0448   ALBUMIN 3.9 10/13/2021 0448   AST 26 10/13/2021 0448   ALT 37 10/13/2021 0448   ALKPHOS 62 10/13/2021 0448   BILITOT 1.0 10/13/2021 0448   GFRNONAA >60 10/13/2021 0448   GFRAA >60 03/11/2019 2118     Diabetic Labs (most recent): Lab Results  Component Value Date   HGBA1C 5.5 (A) 05/28/2022    Assessment & Plan:   1. Prediabetes 2. Class 2 severe obesity due to excess calories with serious comorbidity and body mass index (BMI) of 39.0 to 39.9 in adult [facility]) - Gregory Fry  is being seen at a kind request of Lance Bosch, NP. - I have reviewed his available  records and clinically evaluated the patient. - Based on these reviews, he has class II obesity, history of hypothyroidism and prediabetes,  however,   there is not sufficient information to proceed with definitive treatment plan.  - he will need a repeat,  more complete endocrine labs towards assessing his thyroid, lipids, CMP, parathyroid function.  - Comprehensive metabolic panel - Lipid panel - TSH - T4, free - VITAMIN D 25 Hydroxy (Vit-D Deficiency, Fractures) - Vitamin B12 - PTH, intact and calcium - Magnesium - Phosphorus I did not initiate any intervention for thyroid nor prediabetes/diabetes.  His point-of-care A1c is 5.5% today. This patient has metabolic dysfunction, will benefit the most from lifestyle medicine.  With his permission a packet was opened and discussed with the family.  - he acknowledges that there is a room for improvement in his food and drink choices. - Suggestion is made for him to avoid simple carbohydrates  from his diet including Cakes, Sweet Desserts, Ice Cream, Soda (diet and regular), Sweet Tea, Candies, Chips, Cookies, Store Bought Juices, Alcohol , Artificial Sweeteners,  Coffee Creamer, and "Sugar-free" Products, Lemonade. This will help patient to have more stable blood glucose profile and potentially avoid unintended weight gain.  The following Lifestyle Medicine recommendations according to American College of Lifestyle Medicine  [facility]) were discussed and and offered to patient and he  agrees to start the journey:  A. Whole Foods, Plant-Based Nutrition comprising of fruits and vegetables, plant-based proteins, whole-grain carbohydrates was discussed in detail with the patient.   A list for source of those nutrients were also provided to the patient.  Patient will use only water or unsweetened tea for hydration. B.  The need to stay away from risky substances including alcohol, smoking; obtaining 7 to 9 hours of restorative sleep, at least 150 minutes of moderate intensity exercise weekly, the importance of healthy social connections,  and stress management techniques were discussed. C.  A full color  page of  Calorie density of various food groups per pound showing examples of each food groups was provided to the patient.  - he is advised to maintain close follow up with Lance Bosch, NP for primary care needs.   -  Time spent with the patient: 45 minutes, of which >50% was spent in  counseling him about his high BMI, history of prediabetes and the rest in obtaining information about his symptoms, reviewing his previous labs/studies ( including abstractions from other facilities),  evaluations, and treatments,  and developing a plan to confirm diagnosis and long term treatment based on the latest standards of care/guidelines; and documenting his care. Risk reduction counseling performed per USPSTF guidelines to reduce obesity and cardiovascular risk factors.   Lucky Cowboy Luviano participated in the discussions, expressed understanding, and voiced agreement with the above plans.  All questions were answered to his satisfaction. he is encouraged to contact clinic should he have any questions or concerns prior to his return visit.  Follow up plan: Return in about 2 weeks (around 06/11/2022) for Fasting Labs  in AM B4 8.   Glade Lloyd, MD [facility] [address] [place], [place] [address] Phone: [phone]  Fax: [phone]     05/28/2022, 12:43 PM  This note was partially dictated with voice recognition software. Similar sounding words can be transcribed inadequately or may not  be corrected upon review.

## 2022-06-18 ENCOUNTER — Ambulatory Visit: Payer: BC Managed Care – PPO | Admitting: "Endocrinology

## 2022-07-25 LAB — LIPID PANEL
Chol/HDL Ratio: 3.8 ratio (ref 0.0–5.0)
Cholesterol, Total: 167 mg/dL (ref 100–199)
HDL: 44 mg/dL (ref >39)
LDL Chol Calc (NIH): 101 mg/dL — ABNORMAL HIGH (ref 0–99)
Triglycerides: 123 mg/dL (ref 0–149)
VLDL Cholesterol Cal: 22 mg/dL (ref 5–40)

## 2022-07-25 LAB — COMPREHENSIVE METABOLIC PANEL
ALT: 42 IU/L (ref 0–44)
AST: 36 IU/L (ref 0–40)
Albumin/Globulin Ratio: 2.1 (ref 1.2–2.2)
Albumin: 4.9 g/dL (ref 4.3–5.2)
Alkaline Phosphatase: 93 IU/L (ref 44–121)
BUN/Creatinine Ratio: 10 (ref 9–20)
BUN: 10 mg/dL (ref 6–20)
Bilirubin Total: 1 mg/dL (ref 0.0–1.2)
CO2: 21 mmol/L (ref 20–29)
Calcium: 10 mg/dL (ref 8.7–10.2)
Chloride: 104 mmol/L (ref 96–106)
Creatinine, Ser: 0.98 mg/dL (ref 0.76–1.27)
Globulin, Total: 2.3 g/dL (ref 1.5–4.5)
Glucose: 83 mg/dL (ref 70–99)
Potassium: 4.3 mmol/L (ref 3.5–5.2)
Sodium: 144 mmol/L (ref 134–144)
Total Protein: 7.2 g/dL (ref 6.0–8.5)
eGFR: 108 mL/min/{1.73_m2} (ref >59)

## 2022-07-25 LAB — T4, FREE: Free T4: 1.17 ng/dL (ref 0.82–1.77)

## 2022-07-25 LAB — VITAMIN D 25 HYDROXY (VIT D DEFICIENCY, FRACTURES): Vit D, 25-Hydroxy: 18.4 ng/mL — ABNORMAL LOW (ref 30.0–100.0)

## 2022-07-25 LAB — TSH: TSH: 1.43 u[IU]/mL (ref 0.450–4.500)

## 2022-07-25 LAB — PHOSPHORUS: Phosphorus: 4.1 mg/dL (ref 2.8–4.1)

## 2022-07-25 LAB — MAGNESIUM: Magnesium: 1.8 mg/dL (ref 1.6–2.3)

## 2022-07-25 LAB — PTH, INTACT AND CALCIUM: PTH: 25 pg/mL (ref 15–65)

## 2022-07-25 LAB — VITAMIN B12: Vitamin B-12: 384 pg/mL (ref 232–1245)

## 2022-07-30 ENCOUNTER — Ambulatory Visit: Payer: BC Managed Care – PPO | Admitting: "Endocrinology

## 2022-07-30 ENCOUNTER — Encounter: Payer: Self-pay | Admitting: "Endocrinology

## 2022-07-30 VITALS — BP 116/62 | HR 72 | Ht 74.0 in | Wt 310.6 lb

## 2022-07-30 DIAGNOSIS — R7303 Prediabetes: Secondary | ICD-10-CM | POA: Diagnosis not present

## 2022-07-30 DIAGNOSIS — E559 Vitamin D deficiency, unspecified: Secondary | ICD-10-CM | POA: Diagnosis not present

## 2022-07-30 DIAGNOSIS — K760 Fatty (change of) liver, not elsewhere classified: Secondary | ICD-10-CM

## 2022-07-30 DIAGNOSIS — Z6839 Body mass index (BMI) 39.0-39.9, adult: Secondary | ICD-10-CM

## 2022-07-30 MED ORDER — VITAMIN D3 125 MCG (5000 UT) PO CAPS: 5000.0000 [IU] | ORAL_CAPSULE | Freq: Every day | ORAL | 0 refills | Status: AC

## 2022-07-30 NOTE — Progress Notes (Signed)
07/30/2022, 11:20 AM   Endocrinology follow-up note  Subjective:    Patient ID: Gregory Fry, male    DOB: [birth year], PCP Ferd Hibbs, NP   Past Medical History:  Diagnosis Date   History of kidney stones    Past Surgical History:  Procedure Laterality Date   EUSTACHIAN TUBE DILATION Bilateral    x4   RADIOLOGY WITH ANESTHESIA N/A 05/04/2021   Procedure: MRI WITH ANESTHESIA;  Surgeon: Luanne Bras, MD;  Location: [facility];  Service: Radiology;  Laterality: N/A;   WRIST FRACTURE SURGERY Right    Social History   Socioeconomic History   Marital status: Single    Spouse name: Not on file   Number of children: Not on file   Years of education: Not on file   Highest education level: Not on file  Occupational History   Not on file  Tobacco Use   Smoking status: Never   Smokeless tobacco: Never  Vaping Use   Vaping Use: Every day  Substance and Sexual Activity   Alcohol use: No   Drug use: Not Currently    Types: Marijuana   Sexual activity: Not on file  Other Topics Concern   Not on file  Social History Narrative   Not on file   Social Determinants of Health   Financial Resource Strain: Not on file  Food Insecurity: Not on file  Transportation Needs: Not on file  Physical Activity: Not on file  Stress: Not on file  Social Connections: Not on file   Family History  Problem Relation Age of Onset   Thyroid disease Mother    Gestational diabetes Mother    Hypertension Father    Hyperlipidemia Father    Bladder Cancer Neg Hx    Kidney cancer Neg Hx    Prostate cancer Neg Hx    Outpatient Encounter Medications as of 07/30/2022  Medication Sig   Cholecalciferol (VITAMIN D3) 125 MCG (5000 UT) capsule Take 1 capsule (5,000 Units total) by mouth daily.   albuterol (VENTOLIN HFA) 108 (90 Base) MCG/ACT inhaler Inhale 2 puffs into the lungs every 6 (six) hours as needed for wheezing or shortness of  breath.   gabapentin (NEURONTIN) 300 MG capsule Take 300 mg by mouth 3 (three) times daily as needed (back pain).   [DISCONTINUED] amphetamine-dextroamphetamine (ADDERALL) 20 MG tablet Take 20 mg by mouth See admin instructions. Take one tablet by mouth daily on work days (Patient not taking: Reported on 10/13/2021)   [DISCONTINUED] fluvoxaMINE (LUVOX) 100 MG tablet Take 100 mg by mouth every morning.   [DISCONTINUED] hydrOXYzine (ATARAX) 25 MG tablet Take 25 mg by mouth 3 (three) times daily as needed for anxiety.   [DISCONTINUED] ondansetron (ZOFRAN) 4 MG tablet Take 1 tablet (4 mg total) by mouth every 6 (six) hours. (Patient taking differently: Take 4 mg by mouth every 6 (six) hours as needed for nausea or vomiting.)   [DISCONTINUED] propranolol (INDERAL) 60 MG tablet Take by mouth every morning.   [DISCONTINUED] tamsulosin (FLOMAX) 0.4 MG CAPS capsule Take 1 capsule (0.4 mg total) by mouth daily after breakfast. (Patient taking differently: Take 0.4 mg by mouth daily after breakfast. As needed)   No facility-administered encounter medications on  file as of 07/30/2022.   ALLERGIES: Allergies  Allergen Reactions   Clomipramine Other (See Comments)    nightmares    VACCINATION STATUS:  There is no immunization history on file for this patient.  HPI Gregory Fry is 27 y.o. male who presents today with a medical history as above. he is being seen in follow-up after he was seen in consultation for pre-diabetes , history of hypothyroidism, fatty liver, obesity requested by Ferd Hibbs, NP. -He was given a package of lifestyle medicine during his last visit.  He did not engage optimally yet.  His previsit labs are favorable including normal and liver enzyme profile, thyroid function test, with the exception of mildly elevated LDL.  He also has vitamin D deficiency.   He has no new complaints today.   He has tried metformin in the past for prediabetes.  He is not currently on any   intervention for prediabetes.  His history is such that he has struggled with overweight/obesity for most of his adult life.  He is a former Printmaker including football .  More recently, his weight is fluctuating.  He is not on any particular diet program to lose weight. He is willing to explore his options to manage chronic diseases without medications if possible.  His other medical problems include nephrolithiasis, herniated disc, OCD/ADHD. He is recently came off of a few medications. He wishes to avoid medications.  Review of Systems  Constitutional: + Presents with stable weight,  no fatigue, no subjective hyperthermia, no subjective hypothermia Eyes: no blurry vision, no xerophthalmia ENT: no sore throat, no nodules palpated in throat, no dysphagia/odynophagia, no hoarseness   Objective:       07/30/2022    9:54 AM 05/28/2022    8:27 AM 10/14/2021    5:22 AM  Vitals with BMI  Height 6\' 2"  6\' 2"    Weight 310 lbs 10 oz 310 lbs 6 oz   BMI 123XX123 [id number]   Systolic [id number]  Diastolic 62 88 76  Pulse 72 68 76    BP 116/62   Pulse 72   Ht 6\' 2"  (1.88 m)   Wt (!) 310 lb 9.6 oz (140.9 kg)   BMI 39.88 kg/m   Wt Readings from Last 3 Encounters:  07/30/22 (!) 310 lb 9.6 oz (140.9 kg)  05/28/22 (!) 310 lb 6.4 oz (140.8 kg)  10/14/21 (!) 303 lb 12.7 oz (137.8 kg)    Physical Exam  Constitutional:  Body mass index is 39.88 kg/m.,  not in acute distress, normal state of mind Eyes: PERRLA, EOMI, no exophthalmos ENT: moist mucous membranes, no gross thyromegaly, no gross cervical lymphadenopathy   CMP ( most recent) CMP     Component Value Date/Time   NA 144 07/23/2022 1306   K 4.3 07/23/2022 1306   CL 104 07/23/2022 1306   CO2 21 07/23/2022 1306   GLUCOSE 83 07/23/2022 1306   GLUCOSE 96 10/13/2021 0448   BUN 10 07/23/2022 1306   CREATININE 0.98 07/23/2022 1306   CALCIUM 10.0 07/23/2022 1306   PROT 7.2 07/23/2022 1306   ALBUMIN 4.9 07/23/2022 1306   AST 36 07/23/2022  1306   ALT 42 07/23/2022 1306   ALKPHOS 93 07/23/2022 1306   BILITOT 1.0 07/23/2022 1306   GFRNONAA >60 10/13/2021 0448   GFRAA >60 03/11/2019 2118     Diabetic Labs (most recent): Lab Results  Component Value Date   HGBA1C 5.5 (A) 05/28/2022   Recent Results (from the  past 2160 hour(s))  HgB A1c     Status: Abnormal   Collection Time: 05/28/22  8:43 AM  Result Value Ref Range   Hemoglobin A1C     HbA1c POC (<> result, manual entry)     HbA1c, POC (prediabetic range) 5.5 (A) 5.7 - 6.4 %   HbA1c, POC (controlled diabetic range)    Comprehensive metabolic panel     Status: None   Collection Time: 07/23/22  1:06 PM  Result Value Ref Range   Glucose 83 70 - 99 mg/dL   BUN 10 6 - 20 mg/dL   Creatinine, Ser 0.98 0.76 - 1.27 mg/dL   eGFR 108 >59 mL/min/1.73   BUN/Creatinine Ratio 10 9 - 20   Sodium 144 134 - 144 mmol/L   Potassium 4.3 3.5 - 5.2 mmol/L   Chloride 104 96 - 106 mmol/L   CO2 21 20 - 29 mmol/L   Calcium 10.0 8.7 - 10.2 mg/dL   Total Protein 7.2 6.0 - 8.5 g/dL   Albumin 4.9 4.3 - 5.2 g/dL   Globulin, Total 2.3 1.5 - 4.5 g/dL   Albumin/Globulin Ratio 2.1 1.2 - 2.2   Bilirubin Total 1.0 0.0 - 1.2 mg/dL   Alkaline Phosphatase 93 44 - 121 IU/L   AST 36 0 - 40 IU/L   ALT 42 0 - 44 IU/L  Lipid panel     Status: Abnormal   Collection Time: 07/23/22  1:06 PM  Result Value Ref Range   Cholesterol, Total 167 100 - 199 mg/dL   Triglycerides 123 0 - 149 mg/dL   HDL 44 >39 mg/dL   VLDL Cholesterol Cal 22 5 - 40 mg/dL   LDL Chol Calc (NIH) 101 (H) 0 - 99 mg/dL   Chol/HDL Ratio 3.8 0.0 - 5.0 ratio    Comment:                                   T. Chol/HDL Ratio                                             Men  Women                               1/2 Avg.Risk  3.4    3.3                                   Avg.Risk  5.0    4.4                                2X Avg.Risk  9.6    7.1                                3X Avg.Risk 23.4   11.0   TSH     Status: None   Collection  Time: 07/23/22  1:06 PM  Result Value Ref Range   TSH 1.430 0.450 - 4.500 uIU/mL  T4, free     Status: None   Collection Time: 07/23/22  1:06 PM  Result Value Ref Range   Free T4 1.17 0.82 - 1.77 ng/dL  VITAMIN D 25 Hydroxy (Vit-D Deficiency, Fractures)     Status: Abnormal   Collection Time: 07/23/22  1:06 PM  Result Value Ref Range   Vit D, 25-Hydroxy 18.4 (L) 30.0 - 100.0 ng/mL    Comment: Vitamin D deficiency has been defined by the [place] practice guideline as a level of serum 25-OH vitamin D less than 20 ng/mL (1,2). The Endocrine Society went on to further define vitamin D insufficiency as a level between 21 and 29 ng/mL (2). 1. IOM (Institute of Medicine). 2010. Dietary reference    intakes for calcium and D. [place]: The    [employer]. 2. Holick MF, Binkley [place], Bischoff-Ferrari HA, et al.    Evaluation, treatment, and prevention of vitamin D    deficiency: an Endocrine Society clinical practice    guideline. JCEM. 2011 Jul; 96(7):1911-30.   Vitamin B12     Status: None   Collection Time: 07/23/22  1:06 PM  Result Value Ref Range   Vitamin B-12 384 232 - 1,245 pg/mL  PTH, intact and calcium     Status: None   Collection Time: 07/23/22  1:06 PM  Result Value Ref Range   PTH 25 15 - 65 pg/mL   PTH Interp Comment     Comment: Interpretation                 Intact PTH    Calcium                                 (pg/mL)      (mg/dL) Normal                          15 - 65     8.6 - 10.2 Primary Hyperparathyroidism         >65          >10.2 Secondary Hyperparathyroidism       >65          <10.2 Non-Parathyroid Hypercalcemia       <65          >10.2 Hypoparathyroidism                  <15          < 8.6 Non-Parathyroid Hypocalcemia    15 - 65          < 8.6   Magnesium     Status: None   Collection Time: 07/23/22  1:06 PM  Result Value Ref Range   Magnesium 1.8 1.6 - 2.3 mg/dL  Phosphorus     Status: None    Collection Time: 07/23/22  1:06 PM  Result Value Ref Range   Phosphorus 4.1 2.8 - 4.1 mg/dL     Assessment & Plan:   1. Prediabetes-resolved with recent A1c of 5.5%. 2. Class 2 severe obesity due to excess calories with serious comorbidity and body mass index (BMI) of 39.0 to 39.9 in adult ([place]) 3.  Vitamin D deficiency -I discussed and reviewed his recent labs with him.  In light of his long-term struggle with weight, he was offered the option of starting low-dose Mounjaro.  However, patient rightly rejected this option and would like to work on it without medications.  I fully supported his decision  and allowed him to engage in lifestyle medicine as discussed last time.This patient has metabolic dysfunction, will benefit the most from lifestyle medicine.  He will benefit the most from weight loss.  Even partial engagement in lifestyle medicine will allow him to achieve great outcomes including weight loss.     - he acknowledges that there is a room for improvement in his food and drink choices. - Suggestion is made for him to avoid simple carbohydrates  from his diet including Cakes, Sweet Desserts, Ice Cream, Soda (diet and regular), Sweet Tea, Candies, Chips, Cookies, Store Bought Juices, Alcohol , Artificial Sweeteners,  Coffee Creamer, and "Sugar-free" Products, Lemonade. This will help patient to have more stable blood glucose profile and potentially avoid unintended weight gain.  The following Lifestyle Medicine recommendations according to [facility]) were discussed and and offered to patient and he  agrees to start the journey:  A. Whole Foods, Plant-Based Nutrition comprising of fruits and vegetables, plant-based proteins, whole-grain carbohydrates was discussed in detail with the patient.   A list for source of those nutrients were also provided to the patient.  Patient will use only water or unsweetened tea for hydration. B.  The need to stay away  from risky substances including alcohol, smoking; obtaining 7 to 9 hours of restorative sleep, at least 150 minutes of moderate intensity exercise weekly, the importance of healthy social connections,  and stress management techniques were discussed. C.  A full color page of  Calorie density of various food groups per pound showing examples of each food groups was provided to the patient.  His labs do not show evidence of thyroid dysfunction, would not need any thyroid hormone supplement or antithyroid medications. He will benefit from vitamin D supplement.  I discussed initiated vitamin D 3 5000 units daily. I did not initiate any intervention for thyroid nor prediabetes/diabetes.  His point-of-care A1c is 5.5% today.  - he is advised to maintain close follow up with Ferd Hibbs, NP for primary care needs.  I spent  26  minutes in the care of the patient today including review of labs from Thyroid Function, CMP, and other relevant labs ; imaging/biopsy records (current and previous including abstractions from other facilities); face-to-face time discussing  his lab results and symptoms, medications doses, his options of short and long term treatment based on the latest standards of care / guidelines;   and documenting the encounter.  Lucky Cowboy Zeman  participated in the discussions, expressed understanding, and voiced agreement with the above plans.  All questions were answered to his satisfaction. he is encouraged to contact clinic should he have any questions or concerns prior to his return visit.   Follow up plan: Return in about 6 months (around 01/30/2023) for A1c -NV, Fasting Labs  in AM B4 8.   Glade Lloyd, MD [facility] [address] [place], [place] [address] Phone: [phone]  Fax: [phone]     07/30/2022, 11:20 AM  This note was partially dictated with voice recognition software. Similar sounding words can be  transcribed inadequately or may not  be corrected upon review.

## 2022-07-30 NOTE — Patient Instructions (Signed)

## 2022-09-13 ENCOUNTER — Other Ambulatory Visit: Payer: Self-pay | Admitting: Nurse Practitioner

## 2022-09-13 ENCOUNTER — Ambulatory Visit
Admission: RE | Admit: 2022-09-13 | Discharge: 2022-09-13 | Disposition: A | Discharge status: Home / Self Care | Payer: BC Managed Care – PPO | Source: Ambulatory Visit | Attending: Nurse Practitioner | Admitting: Nurse Practitioner

## 2022-09-13 DIAGNOSIS — R0789 Other chest pain: Secondary | ICD-10-CM

## 2022-09-14 ENCOUNTER — Emergency Department (HOSPITAL_BASED_OUTPATIENT_CLINIC_OR_DEPARTMENT_OTHER)
Admission: EM | Admit: 2022-09-14 | Discharge: 2022-09-15 | Disposition: A | Discharge status: Home / Self Care | Payer: BC Managed Care – PPO | Attending: Emergency Medicine | Admitting: Emergency Medicine

## 2022-09-14 ENCOUNTER — Emergency Department (HOSPITAL_BASED_OUTPATIENT_CLINIC_OR_DEPARTMENT_OTHER): Payer: BC Managed Care – PPO | Admitting: Radiology

## 2022-09-14 ENCOUNTER — Other Ambulatory Visit: Payer: Self-pay

## 2022-09-14 ENCOUNTER — Encounter (HOSPITAL_BASED_OUTPATIENT_CLINIC_OR_DEPARTMENT_OTHER): Payer: Self-pay

## 2022-09-14 DIAGNOSIS — R0789 Other chest pain: Secondary | ICD-10-CM | POA: Insufficient documentation

## 2022-09-14 DIAGNOSIS — R079 Chest pain, unspecified: Secondary | ICD-10-CM | POA: Diagnosis present

## 2022-09-14 LAB — CBC
HCT: 44.9 % (ref 39.0–52.0)
Hemoglobin: 15.1 g/dL (ref 13.0–17.0)
MCH: 27.3 pg (ref 26.0–34.0)
MCHC: 33.6 g/dL (ref 30.0–36.0)
MCV: 81 fL (ref 80.0–100.0)
Platelets: 294 10*3/uL (ref 150–400)
RBC: 5.54 MIL/uL (ref 4.22–5.81)
RDW: 12.7 % (ref 11.5–15.5)
WBC: 9.9 10*3/uL (ref 4.0–10.5)
nRBC: 0 % (ref 0.0–0.2)

## 2022-09-14 LAB — BASIC METABOLIC PANEL
Anion gap: 14 (ref 5–15)
BUN: 13 mg/dL (ref 6–20)
CO2: 20 mmol/L — ABNORMAL LOW (ref 22–32)
Calcium: 9.6 mg/dL (ref 8.9–10.3)
Chloride: 103 mmol/L (ref 98–111)
Creatinine, Ser: 0.85 mg/dL (ref 0.61–1.24)
GFR, Estimated: >60 mL/min (ref >60)
Glucose, Bld: 87 mg/dL (ref 70–99)
Potassium: 3.6 mmol/L (ref 3.5–5.1)
Sodium: 137 mmol/L (ref 135–145)

## 2022-09-14 LAB — TROPONIN I (HIGH SENSITIVITY): Troponin I (High Sensitivity): 4 ng/L (ref <18)

## 2022-09-14 NOTE — ED Triage Notes (Signed)
Patient here POV for Home.  Endorses CP for approximately 5 Days. Associated with SOB and Lightheadedness upon Standing.  Seen by PCP and placed on Heart Monitor.   NAD Noted during Triage. A&Ox4. GCS 15. Ambulatory.

## 2022-09-15 LAB — TROPONIN I (HIGH SENSITIVITY): Troponin I (High Sensitivity): 4 ng/L (ref <18)

## 2022-09-15 MED ORDER — ONDANSETRON 8 MG PO TBDP: 8.0000 mg | ORAL_TABLET | Freq: Three times a day (TID) | ORAL | 0 refills | Status: AC | PRN

## 2022-09-15 MED ORDER — NAPROXEN 250 MG PO TABS
500.0000 mg | ORAL_TABLET | Freq: Once | ORAL | Status: AC
  Administered 2022-09-15: 500 mg via ORAL
  Filled 2022-09-15: qty 2

## 2022-09-15 MED ORDER — NAPROXEN 500 MG PO TABS: 500.0000 mg | ORAL_TABLET | Freq: Two times a day (BID) | ORAL | 0 refills | Status: AC

## 2022-09-15 NOTE — Progress Notes (Signed)
Harley Alto, MD Reason for referral-chest pain  HPI: 27 year old male for evaluation of chest pain at request of Dione Booze, MD.  Seen in the emergency room May 7 with chest pain.  Troponins were normal.  Chest x-ray showed no active disease.  Hemoglobin 15.1, creatinine 0.85.  Pain was felt to be musculoskeletal in etiology and cardiology asked to evaluate.  Patient states that over the past 2 weeks he has had occasional chest pain.  It is in the left breast and left substernal area and occasionally under the left breast.  It is described as a dull pressure.  It can last an hour at a time.  It increases with vaping and inspiration.  There has been some diaphoresis but no associated dyspnea or nausea.  He has some dyspnea on exertion but no orthopnea, PND, pedal edema.  He has also had occasional episodes of his heart rate elevated.  He also has had problems with dizziness with standing that improved with hydration.  Cardiology now asked to evaluate.  Current Outpatient Medications  Medication Sig Dispense Refill   albuterol (VENTOLIN HFA) 108 (90 Base) MCG/ACT inhaler Inhale 2 puffs into the lungs every 6 (six) hours as needed for wheezing or shortness of breath.     Cholecalciferol (VITAMIN D3) 125 MCG (5000 UT) capsule Take 1 capsule (5,000 Units total) by mouth daily. 90 capsule 0   naproxen (NAPROSYN) 500 MG tablet Take 1 tablet (500 mg total) by mouth 2 (two) times daily. 30 tablet 0   ondansetron (ZOFRAN-ODT) 8 MG disintegrating tablet Take 1 tablet (8 mg total) by mouth every 8 (eight) hours as needed for nausea or vomiting. 20 tablet 0   gabapentin (NEURONTIN) 300 MG capsule Take 300 mg by mouth 3 (three) times daily as needed (back pain). (Patient not taking: Reported on 09/21/2022)     No current facility-administered medications for this visit.    Allergies  Allergen Reactions   Clomipramine Other (See Comments)    nightmares     Past Medical History:  Diagnosis  Date   Asthma    History of kidney stones    Hypertension     Past Surgical History:  Procedure Laterality Date   EUSTACHIAN TUBE DILATION Bilateral    x4   RADIOLOGY WITH ANESTHESIA N/A 05/04/2021   Procedure: MRI WITH ANESTHESIA;  Surgeon: Julieanne Cotton, MD;  Location: [facility];  Service: Radiology;  Laterality: N/A;   tonsillectomy     WRIST FRACTURE SURGERY Right     Social History   Socioeconomic History   Marital status: Single    Spouse name: Not on file   Number of children: Not on file   Years of education: Not on file   Highest education level: Not on file  Occupational History   Not on file  Tobacco Use   Smoking status: Never   Smokeless tobacco: Never  Vaping Use   Vaping Use: Every day  Substance and Sexual Activity   Alcohol use: Yes    Comment: rare   Drug use: Yes    Frequency: 7.0 times per week    Types: Marijuana   Sexual activity: Not on file  Other Topics Concern   Not on file  Social History Narrative   Not on file   Social Determinants of Health   Financial Resource Strain: Not on file  Food Insecurity: Not on file  Transportation Needs: Not on file  Physical Activity: Not on file  Stress: Not  on file  Social Connections: Not on file  Intimate Partner Violence: Not on file    Family History  Problem Relation Age of Onset   Thyroid disease Mother    Gestational diabetes Mother    Hypertension Father    Hyperlipidemia Father    Bladder Cancer Neg Hx    Kidney cancer Neg Hx    Prostate cancer Neg Hx     ROS: no fevers or chills, productive cough, hemoptysis, dysphasia, odynophagia, melena, hematochezia, dysuria, hematuria, rash, seizure activity, orthopnea, PND, pedal edema, claudication. Remaining systems are negative.  Physical Exam:   Blood pressure (!) 146/80, pulse 72, height 6\' 3"  (1.905 m), weight 297 lb (134.7 kg), SpO2 97 %.  General:  Well developed/well nourished in NAD Skin warm/dry Patient not depressed No  peripheral clubbing Back-normal HEENT-normal/normal eyelids Neck supple/normal carotid upstroke bilaterally; no bruits; no JVD; no thyromegaly chest - CTA/ normal expansion CV - RRR/normal S1 and S2; no murmurs, rubs or gallops;  PMI nondisplaced Abdomen -NT/ND, no HSM, no mass, + bowel sounds, no bruit 2+ femoral pulses, no bruits Ext-no edema, chords, 2+ DP Neuro-grossly nonfocal  ECG -Sep 14, 2022-normal sinus rhythm, right axis deviation.  Personally reviewed  Electrocardiogram performed today personally reviewed and shows normal sinus rhythm at a rate of 72, nonspecific ST changes.  A/P  1 chest pain-symptoms are atypical and electrocardiogram shows no ST changes.  Will arrange echocardiogram to assess LV function and rule out pericardial effusion.  If normal we will not pursue further cardiac evaluation.  2 palpitations-patient has had elevated heart rate at times.  He has a monitor in place ordered by primary care and we will review once the strips are available.  3 orthostatic symptoms-he describes some dizziness with standing.  This improves with hydration.  I have asked him to stay hydrated and increase his sodium intake.  Olga Millers, MD

## 2022-09-15 NOTE — ED Provider Notes (Signed)
[place] EMERGENCY DEPARTMENT AT [facility] Provider Note   CSN: [id number] Arrival date & time: 09/14/22  2213     History  Chief Complaint  Patient presents with   Chest Pain    Gregory Fry is a 27 y.o. male.  The history is provided by the patient.  Chest Pain He has history of kidney stones and comes in with chest pain for about the last 6 days.  He states that the pain started in the left scapular area but is now primarily in the left lower chest.  It is a dull pain that comes and goes on its own.  Nothing seems to make it better and nothing seems to make it worse.  It has been stable over the 6 days.  At various times, he has had nausea or vomiting or dyspnea or sweats, but they have not been consistent throughout the 6 days.  He denies any cough or fever.  He has been taking ibuprofen on a few occasions which gives slight, temporary relief.  On 1 occasion last week, he had a near syncopal episode when he got up from a crouched position.  He did see his primary care provider who noted marked orthostatic changes and gave him 2 L of fluid.  She also ordered an outpatient heart monitor and echocardiogram.  He is a non-smoker but does vape.  There is no history of diabetes or hypertension or hyperlipidemia.  There is a family history of premature coronary atherosclerosis, but not in first-degree relatives   Home Medications Prior to Admission medications   Medication Sig Start Date End Date Taking? Authorizing Provider  naproxen (NAPROSYN) 500 MG tablet Take 1 tablet (500 mg total) by mouth 2 (two) times daily. 09/15/22  Yes Dione Booze, MD  ondansetron (ZOFRAN-ODT) 8 MG disintegrating tablet Take 1 tablet (8 mg total) by mouth every 8 (eight) hours as needed for nausea or vomiting. 09/15/22  Yes Dione Booze, MD  albuterol (VENTOLIN HFA) 108 (90 Base) MCG/ACT inhaler Inhale 2 puffs into the lungs every 6 (six) hours as needed for wheezing or shortness of breath.    [provider]  Cholecalciferol (VITAMIN D3) 125 MCG (5000 UT) capsule Take 1 capsule (5,000 Units total) by mouth daily. 07/30/22   Roma Kayser, MD  gabapentin (NEURONTIN) 300 MG capsule Take 300 mg by mouth 3 (three) times daily as needed (back pain).    [provider]      Allergies    Clomipramine    Review of Systems   Review of Systems  Cardiovascular:  Positive for chest pain.  All other systems reviewed and are negative.   Physical Exam Updated Vital Signs BP (!) 153/87   Pulse 76   Temp 98.2 F (36.8 C) (Oral)   Resp 17   Ht 6\' 4"  (1.93 m)   Wt 136.1 kg   SpO2 96%   BMI 36.52 kg/m  Physical Exam Vitals and nursing note reviewed.   27 year old male, resting comfortably and in no acute distress. Vital signs are significant for elevated blood pressure. Oxygen saturation is 96%, which is normal. Head is normocephalic and atraumatic. PERRLA, EOMI. Oropharynx is clear. Neck is nontender and supple without adenopathy or JVD. Back is nontender and there is no CVA tenderness. Lungs are clear without rales, wheezes, or rhonchi. Chest is mildly tender in the left lower rib cage anteriorly.  There is no crepitus. Heart has regular rate and rhythm without murmur.  Abdomen is soft, flat, nontender. Extremities have no cyanosis or edema, full range of motion is present. Skin is warm and dry without rash. Neurologic: Mental status is normal, cranial nerves are intact, moves all extremities equally.  ED Results / Procedures / Treatments   Labs (all labs ordered are listed, but only abnormal results are displayed) Labs Reviewed  BASIC METABOLIC PANEL - Abnormal; Notable for the following components:      Result Value   CO2 20 (*)    All other components within normal limits  CBC  TROPONIN I (HIGH SENSITIVITY)  TROPONIN I (HIGH SENSITIVITY)    EKG EKG Interpretation  Date/Time:  Tuesday Sep 14 2022 22:19:07 EDT Ventricular Rate:  78 PR  Interval:  160 QRS Duration: 102 QT Interval:  358 QTC Calculation: 408 R Axis:   91 Text Interpretation: Normal sinus rhythm Rightward axis Cannot rule out Anterior infarct , age undetermined Abnormal ECG When compared with ECG of 10-May-2021 20:17, No significant change was found Reconfirmed by Dione Booze ([id number]) on 09/15/2022 1:30:11 AM  Radiology DG Chest 2 View  Result Date: 09/14/2022 CLINICAL DATA:  Chest pain EXAM: CHEST - 2 VIEW COMPARISON:  10/08/2020 FINDINGS: The heart size and mediastinal contours are within normal limits. Both lungs are clear. The visualized skeletal structures are unremarkable. IMPRESSION: No active cardiopulmonary disease. Electronically Signed   By: Helyn Numbers M.D.   On: 09/14/2022 22:59   DG Chest 2 View  Result Date: 09/14/2022 CLINICAL DATA:  Chest pain EXAM: CHEST - 2 VIEW COMPARISON:  None Available. FINDINGS: The heart size and mediastinal contours are within normal limits. Both lungs are clear. The visualized skeletal structures are unremarkable. IMPRESSION: No active cardiopulmonary disease. Electronically Signed   By: Helyn Numbers M.D.   On: 09/14/2022 22:59    Procedures Procedures  Cardiac monitor shows normal sinus rhythm, per my interpretation.  Medications Ordered in ED Medications  naproxen (NAPROSYN) tablet 500 mg (500 mg Oral Given 09/15/22 0151)    ED Course/ Medical Decision Making/ A&P             HEART Score: 2                Medical Decision Making  Chest pain of uncertain cause.  I have low index of suspicion for ACS.  I suspect this is musculoskeletal pain.  Doubt pulmonary embolism, pneumonia.  Consider atypical viral infection.  Orthostatic vital signs here show no significant change in heart rate or blood pressure.  I have reviewed and interpreted his electrocardiogram, my interpretation is nonspecific T wave flattening.  Chest x-ray is normal.  I have independently viewed the images, and agree with the radiologist's  interpretation.  I have reviewed and interpreted his laboratory tests, my interpretation is normal CBC, normal basic metabolic panel, normal troponin x 2.  ACS is effectively ruled out by this.  I do not feel that he needs advanced imaging such as CT angiogram of the chest.  Heart score is 2, which puts him at low risk for major adverse cardiac events in the next 6 weeks.  However, since his primary care provider has started a cardiac evaluation and cause for his pain is unclear, I am giving him an ambulatory referral to cardiology.  I am also giving him a prescription for naproxen with initial dose being given prior to discharge.  I have recommended that he use over-the-counter acetaminophen to get additional pain relief.  Also, since he has been having some  nausea and vomiting, I have given a prescription for ondansetron oral dissolving tablet.  Return precautions discussed.  Final Clinical Impression(s) / ED Diagnoses Final diagnoses:  Atypical chest pain    Rx / DC Orders ED Discharge Orders          Ordered    Ambulatory referral to Cardiology       Comments: If you have not heard from the Cardiology office within the next 72 hours please call [phone].   09/15/22 0144    naproxen (NAPROSYN) 500 MG tablet  2 times daily        09/15/22 0145    ondansetron (ZOFRAN-ODT) 8 MG disintegrating tablet  Every 8 hours PRN        09/15/22 0149              Dione Booze, MD 09/15/22 0200

## 2022-09-15 NOTE — Discharge Instructions (Signed)
Your evaluation today did not show any signs of problems with your heart, and your blood pressure and heart rate did not change when you stood up today.  I suspect that your pain is muscular and I have ordered an anti-inflammatory medication for you which you should take every day, twice a day.  You may add acetaminophen to get additional pain relief.  Please follow-up with the cardiologist to see if they feel that any additional testing is needed.

## 2022-09-21 ENCOUNTER — Encounter: Payer: Self-pay | Admitting: Cardiology

## 2022-09-21 ENCOUNTER — Ambulatory Visit: Payer: BC Managed Care – PPO | Attending: Cardiology | Admitting: Cardiology

## 2022-09-21 VITALS — BP 146/80 | HR 72 | Ht 75.0 in | Wt 297.0 lb

## 2022-09-21 DIAGNOSIS — R002 Palpitations: Secondary | ICD-10-CM | POA: Diagnosis not present

## 2022-09-21 DIAGNOSIS — R072 Precordial pain: Secondary | ICD-10-CM

## 2022-09-21 NOTE — Patient Instructions (Signed)
  Testing/Procedures:  Your physician has requested that you have an echocardiogram. Echocardiography is a painless test that uses sound waves to create images of your heart. It provides your doctor with information about the size and shape of your heart and how well your heart's chambers and valves are working. This procedure takes approximately one hour. There are no restrictions for this procedure. Please do NOT wear cologne, perfume, aftershave, or lotions (deodorant is allowed). Please arrive 15 minutes prior to your appointment time. [address]   Follow-Up: At [facility], you and your health needs are our priority.  As part of our continuing mission to provide you with exceptional heart care, we have created designated Provider Care Teams.  These Care Teams include your primary Cardiologist (physician) and Advanced Practice Providers (APPs -  Physician Assistants and Nurse Practitioners) who all work together to provide you with the care you need, when you need it.  We recommend signing up for the patient portal called "MyChart".  Sign up information is provided on this After Visit Summary.  MyChart is used to connect with patients for Virtual Visits (Telemedicine).  Patients are able to view lab/test results, encounter notes, upcoming appointments, etc.  Non-urgent messages can be sent to your provider as well.   To learn more about what you can do with MyChart, go to [email or web address].    Your next appointment:    AS NEEDED   

## 2022-10-18 ENCOUNTER — Telehealth: Payer: Self-pay | Admitting: *Deleted

## 2022-10-18 NOTE — Telephone Encounter (Addendum)
Spoke with pt, aware of monitor results.   ----- Message from Lewayne Bunting, MD sent at 10/16/2022  1:56 AM EDT ----- Sinus with short run of SVT Gregory Fry

## 2022-10-22 ENCOUNTER — Ambulatory Visit (HOSPITAL_COMMUNITY): Payer: BC Managed Care – PPO | Attending: Cardiology

## 2022-10-22 DIAGNOSIS — R072 Precordial pain: Secondary | ICD-10-CM | POA: Diagnosis present

## 2022-10-22 LAB — ECHOCARDIOGRAM COMPLETE
Area-P 1/2: 4.31 cm2
S' Lateral: 2.2 cm

## 2022-10-27 ENCOUNTER — Encounter: Payer: Self-pay | Admitting: *Deleted

## 2023-02-04 ENCOUNTER — Ambulatory Visit: Payer: BC Managed Care – PPO | Admitting: "Endocrinology

## 2023-03-16 IMAGING — CT CT RENAL STONE PROTOCOL
2 of 4 series · 16 of 46 positions shown, 18 images · non-contrast
Comparison: 03/11/2019

CLINICAL DATA: Flank pain.  Kidney stone suspected.



[Series 2: stone full · axial · 0.89mm/px · z∈[+539,+1084]mm · 13 of 119 slices shown, 15 images]
[im 5/119  soft-tissue]
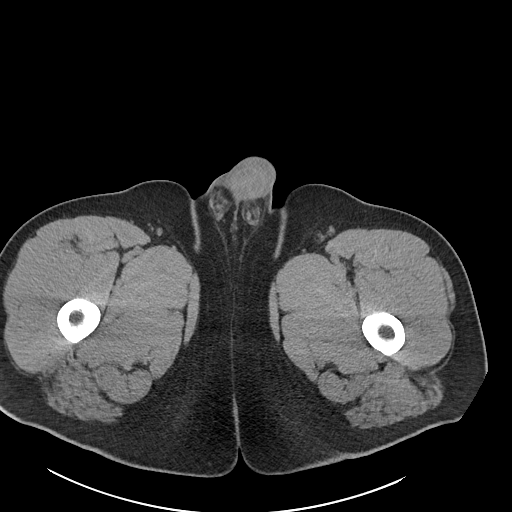
[im 5/119  bone]
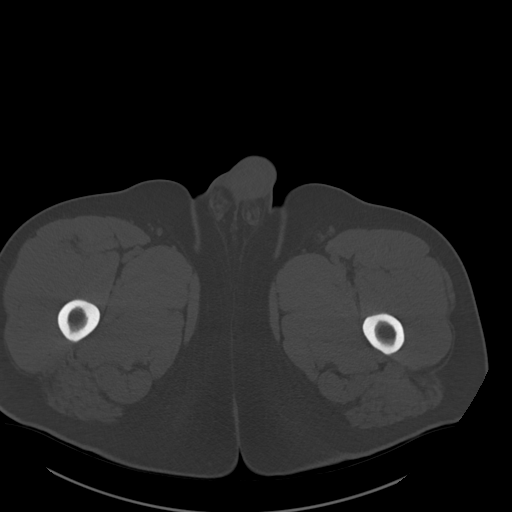
[im 15/119  soft-tissue]
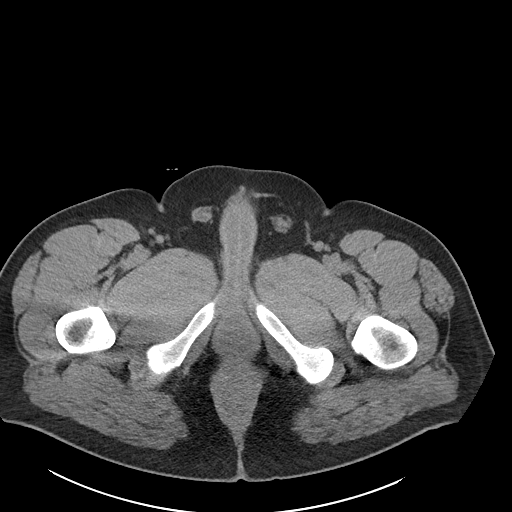
[im 25/119  soft-tissue]
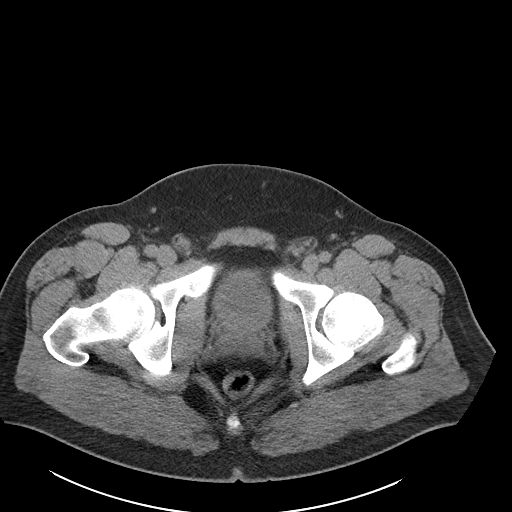
[im 35/119  soft-tissue]
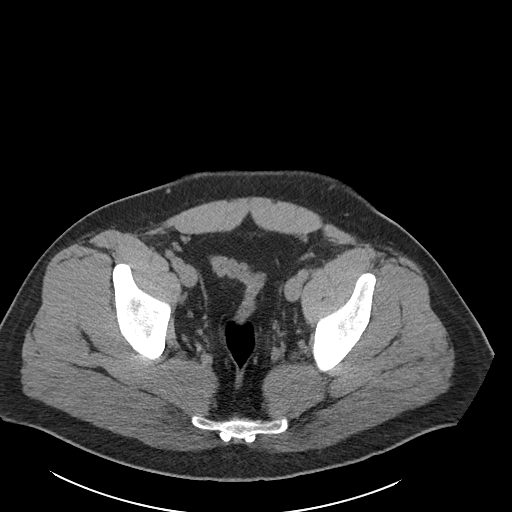
[im 40/119  soft-tissue]
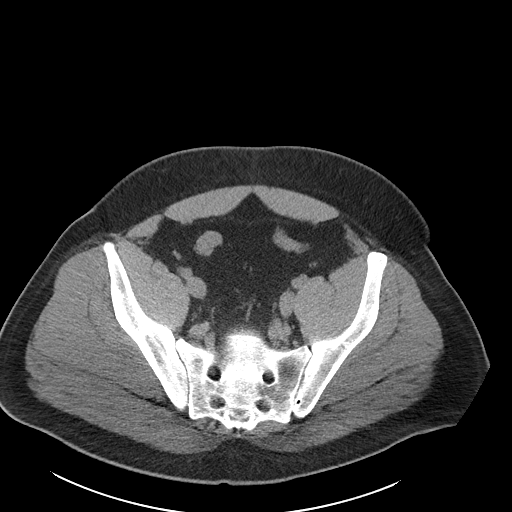
[im 50/119  soft-tissue]
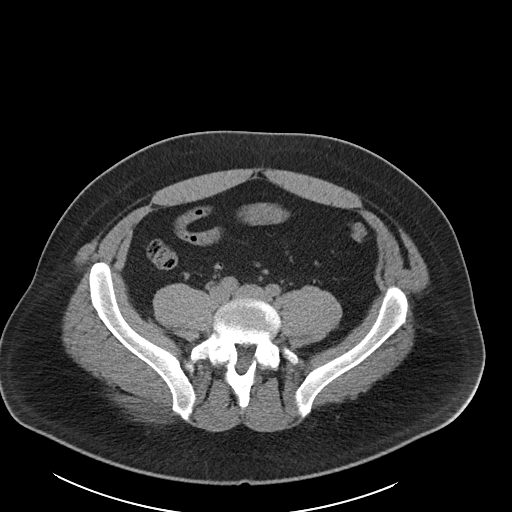
[im 60/119  soft-tissue]
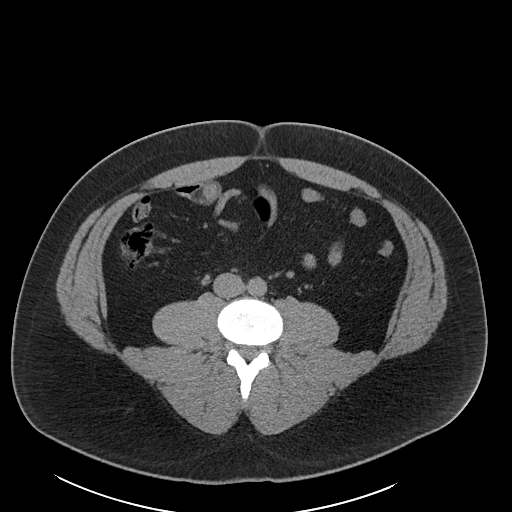
[im 69/119  soft-tissue]
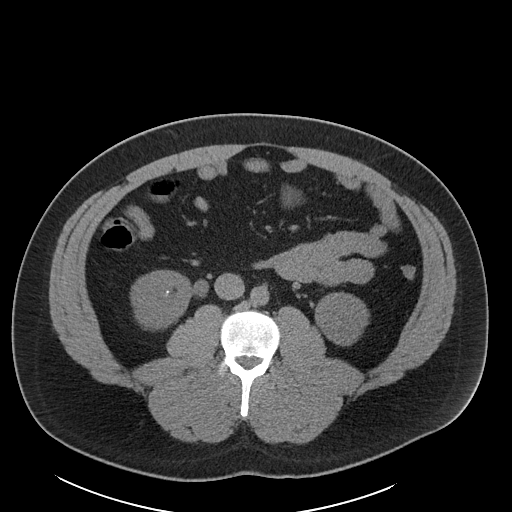
[im 79/119  soft-tissue]
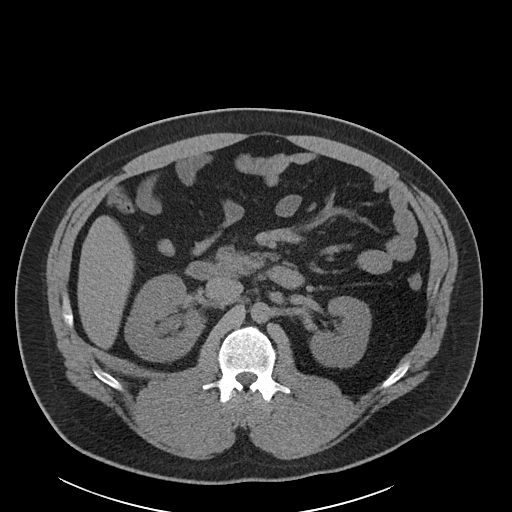
[im 79/119  bone]
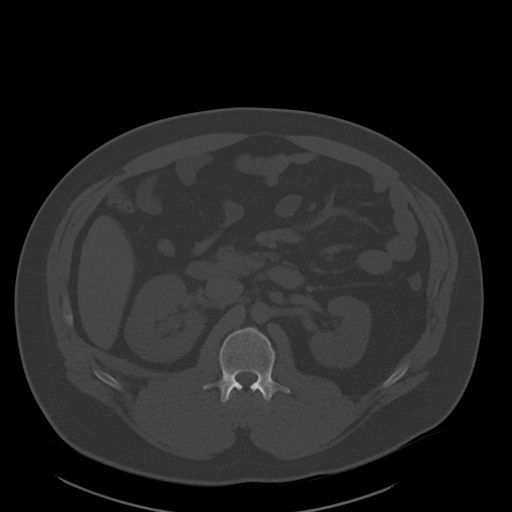
[im 84/119  soft-tissue]
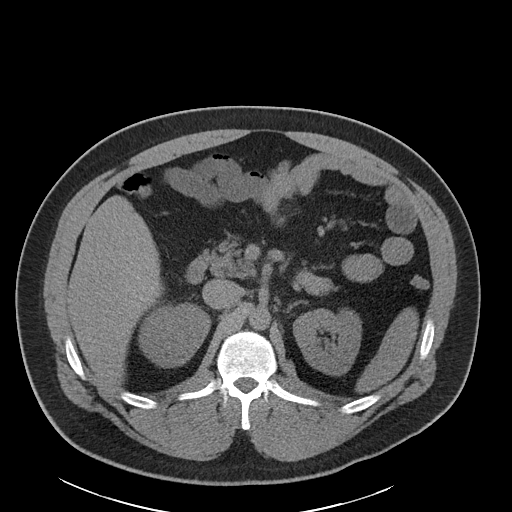
[im 94/119  soft-tissue]
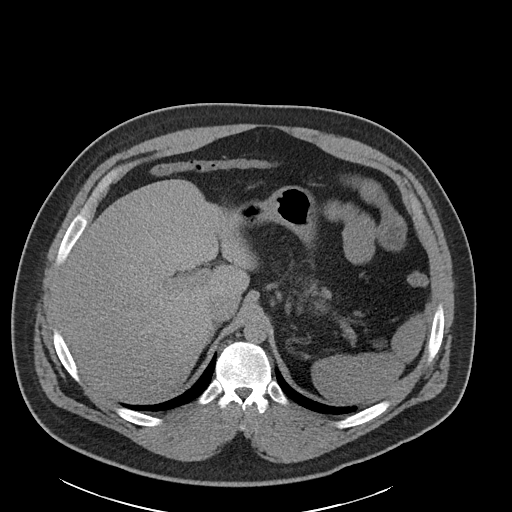
[im 104/119  soft-tissue]
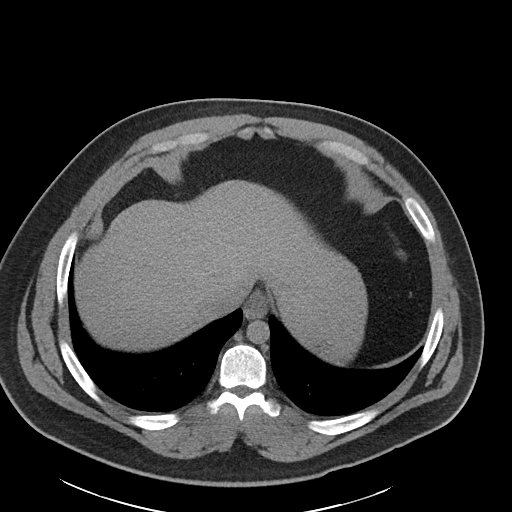
[im 114/119  soft-tissue]
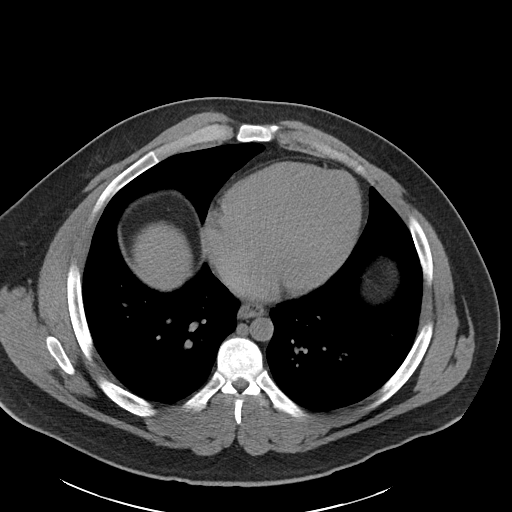

[Series 5: coronal · coronal · 0.95mm/px · 3 of 108 slices shown]
[im 36/108  soft-tissue]
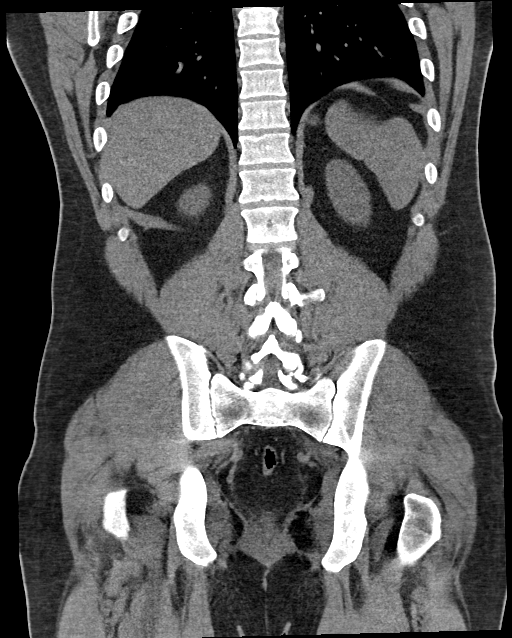
[im 48/108  soft-tissue]
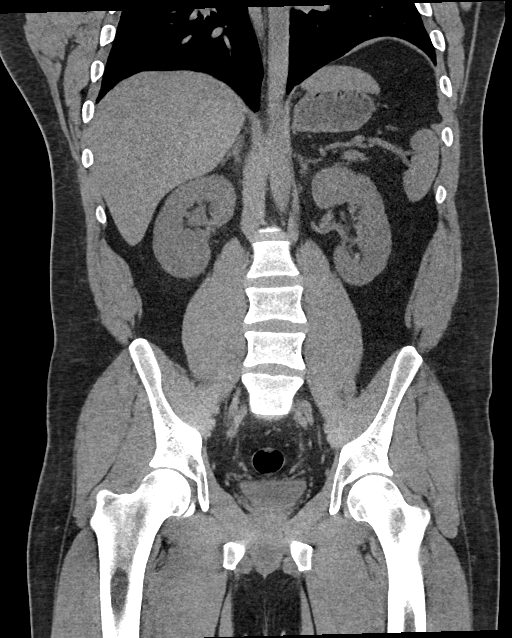
[im 60/108  soft-tissue]
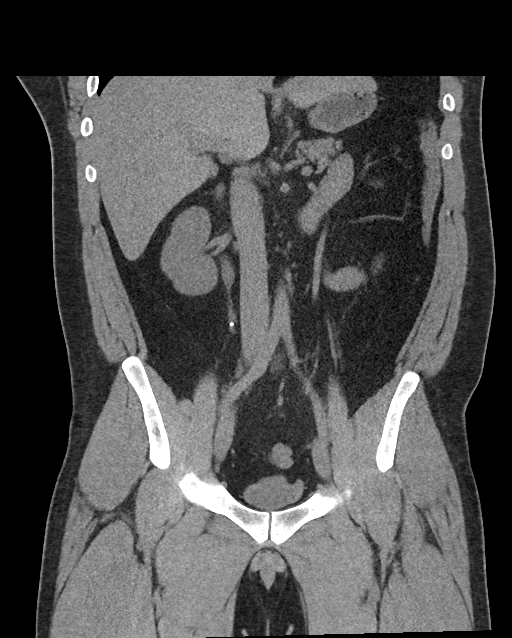

[16 of 46 positions shown; findings below may reference images not displayed]

FINDINGS: Lower chest: Clear lung bases. Normal heart size without pericardial
or pleural effusion.

Hepatobiliary: Hepatomegaly at greater than 20 cm. Normal
gallbladder, without biliary ductal dilatation.

Pancreas: Normal, without mass or ductal dilatation.

Spleen: Normal in size, without focal abnormality.

Adrenals/Urinary Tract: Normal adrenal glands. Punctate right renal
collecting system calculi. Mild right hydroureter to the level of a
proximal right ureteric 4 mm stone on 61/2. No bladder calculi.

Stomach/Bowel: Normal stomach, without wall thickening. Normal
colon, appendix, and terminal ileum. Normal small bowel.

Vascular/Lymphatic: Normal caliber of the aorta and branch vessels.
Mildly prominent ileocolic mesenteric nodes are within normal
variation for age.

Reproductive: Normal prostate.

Other: No significant free fluid.  No free intraperitoneal air.

Musculoskeletal: Bilateral L5 pars defects.
IMPRESSION: 1. Mild right-sided urinary tract obstruction secondary to a
proximal right ureteric 4 mm stone.
2. Right nephrolithiasis.
3. Hepatomegaly.

## 2023-06-13 LAB — LAB REPORT - SCANNED: EGFR: 110

## 2023-07-04 ENCOUNTER — Encounter (HOSPITAL_BASED_OUTPATIENT_CLINIC_OR_DEPARTMENT_OTHER): Payer: Self-pay | Admitting: Internal Medicine

## 2023-07-04 DIAGNOSIS — R0683 Snoring: Secondary | ICD-10-CM

## 2023-11-04 ENCOUNTER — Encounter (HOSPITAL_COMMUNITY): Payer: Self-pay | Admitting: Interventional Radiology

## 2023-12-13 NOTE — Procedures (Signed)
 Gregory Fry

## 2024-01-11 ENCOUNTER — Other Ambulatory Visit: Payer: Self-pay | Admitting: Nurse Practitioner

## 2024-01-11 DIAGNOSIS — R109 Unspecified abdominal pain: Secondary | ICD-10-CM

## 2024-01-20 ENCOUNTER — Other Ambulatory Visit

## 2024-01-27 ENCOUNTER — Ambulatory Visit
Admission: RE | Admit: 2024-01-27 | Discharge: 2024-01-27 | Disposition: A | Discharge status: Home / Self Care | Source: Ambulatory Visit | Attending: Nurse Practitioner | Admitting: Nurse Practitioner

## 2024-01-27 DIAGNOSIS — R109 Unspecified abdominal pain: Secondary | ICD-10-CM

## 2024-02-15 ENCOUNTER — Other Ambulatory Visit: Payer: Self-pay | Admitting: Nurse Practitioner

## 2024-02-15 DIAGNOSIS — R1011 Right upper quadrant pain: Secondary | ICD-10-CM
